# Patient Record
Sex: Female | Born: 1950 | Race: White | Hispanic: No | Smoking: Former smoker
Health system: Southern US, Community
[De-identification: ages and names within clinical notes are randomized; demographics above are authoritative.]

## PROBLEM LIST (undated history)

## (undated) DIAGNOSIS — M1990 Unspecified osteoarthritis, unspecified site: Secondary | ICD-10-CM

## (undated) DIAGNOSIS — C44601 Unspecified malignant neoplasm of skin of unspecified upper limb, including shoulder: Secondary | ICD-10-CM

## (undated) DIAGNOSIS — C44301 Unspecified malignant neoplasm of skin of nose: Secondary | ICD-10-CM

## (undated) DIAGNOSIS — H269 Unspecified cataract: Secondary | ICD-10-CM

## (undated) DIAGNOSIS — Z5189 Encounter for other specified aftercare: Secondary | ICD-10-CM

## (undated) DIAGNOSIS — E7800 Pure hypercholesterolemia, unspecified: Secondary | ICD-10-CM

## (undated) DIAGNOSIS — K219 Gastro-esophageal reflux disease without esophagitis: Secondary | ICD-10-CM

## (undated) DIAGNOSIS — F419 Anxiety disorder, unspecified: Secondary | ICD-10-CM

## (undated) DIAGNOSIS — T7840XA Allergy, unspecified, initial encounter: Secondary | ICD-10-CM

## (undated) DIAGNOSIS — K635 Polyp of colon: Secondary | ICD-10-CM

## (undated) HISTORY — DX: Unspecified cataract: H26.9

## (undated) HISTORY — DX: Anxiety disorder, unspecified: F41.9

## (undated) HISTORY — PX: SKIN CANCER EXCISION: SHX779

## (undated) HISTORY — DX: Pure hypercholesterolemia, unspecified: E78.00

## (undated) HISTORY — DX: Polyp of colon: K63.5

## (undated) HISTORY — PX: ABDOMINAL HYSTERECTOMY: SHX81

## (undated) HISTORY — DX: Encounter for other specified aftercare: Z51.89

## (undated) HISTORY — DX: Unspecified osteoarthritis, unspecified site: M19.90

## (undated) HISTORY — DX: Unspecified malignant neoplasm of skin of unspecified upper limb, including shoulder: C44.601

## (undated) HISTORY — PX: OTHER SURGICAL HISTORY: SHX169

## (undated) HISTORY — PX: VAGINAL HYSTERECTOMY: SHX2639

## (undated) HISTORY — DX: Gastro-esophageal reflux disease without esophagitis: K21.9

## (undated) HISTORY — DX: Allergy, unspecified, initial encounter: T78.40XA

## (undated) HISTORY — DX: Unspecified malignant neoplasm of skin of nose: C44.301

---

## 1974-01-05 HISTORY — PX: ECTOPIC PREGNANCY SURGERY: SHX613

## 2005-01-05 HISTORY — PX: ADRENALECTOMY: SHX876

## 2007-01-06 HISTORY — PX: AORTIC/RENAL BYPASS: SHX6299

## 2009-01-05 HISTORY — PX: HERNIA REPAIR: SHX51

## 2014-01-20 DIAGNOSIS — N390 Urinary tract infection, site not specified: Secondary | ICD-10-CM | POA: Insufficient documentation

## 2014-01-20 DIAGNOSIS — I739 Peripheral vascular disease, unspecified: Secondary | ICD-10-CM | POA: Insufficient documentation

## 2014-01-20 DIAGNOSIS — K439 Ventral hernia without obstruction or gangrene: Secondary | ICD-10-CM | POA: Insufficient documentation

## 2014-01-20 DIAGNOSIS — G250 Essential tremor: Secondary | ICD-10-CM | POA: Insufficient documentation

## 2014-01-20 DIAGNOSIS — K635 Polyp of colon: Secondary | ICD-10-CM | POA: Insufficient documentation

## 2015-01-28 DIAGNOSIS — M25559 Pain in unspecified hip: Secondary | ICD-10-CM | POA: Insufficient documentation

## 2015-07-23 DIAGNOSIS — J069 Acute upper respiratory infection, unspecified: Secondary | ICD-10-CM | POA: Insufficient documentation

## 2016-03-31 DIAGNOSIS — E041 Nontoxic single thyroid nodule: Secondary | ICD-10-CM | POA: Insufficient documentation

## 2017-04-01 DIAGNOSIS — R9431 Abnormal electrocardiogram [ECG] [EKG]: Secondary | ICD-10-CM | POA: Insufficient documentation

## 2017-04-02 DIAGNOSIS — E781 Pure hyperglyceridemia: Secondary | ICD-10-CM | POA: Insufficient documentation

## 2019-08-08 DIAGNOSIS — Z87891 Personal history of nicotine dependence: Secondary | ICD-10-CM | POA: Insufficient documentation

## 2020-04-03 ENCOUNTER — Other Ambulatory Visit: Payer: Self-pay

## 2020-04-03 ENCOUNTER — Encounter: Payer: Self-pay | Admitting: Osteopathic Medicine

## 2020-04-03 ENCOUNTER — Ambulatory Visit (INDEPENDENT_AMBULATORY_CARE_PROVIDER_SITE_OTHER): Payer: Medicare Other | Admitting: Osteopathic Medicine

## 2020-04-03 VITALS — BP 145/83 | HR 69 | Temp 98.2°F | Ht 62.0 in | Wt 169.0 lb

## 2020-04-03 DIAGNOSIS — R6889 Other general symptoms and signs: Secondary | ICD-10-CM

## 2020-04-03 DIAGNOSIS — E896 Postprocedural adrenocortical (-medullary) hypofunction: Secondary | ICD-10-CM

## 2020-04-03 DIAGNOSIS — E782 Mixed hyperlipidemia: Secondary | ICD-10-CM | POA: Diagnosis not present

## 2020-04-03 DIAGNOSIS — F419 Anxiety disorder, unspecified: Secondary | ICD-10-CM

## 2020-04-03 DIAGNOSIS — F339 Major depressive disorder, recurrent, unspecified: Secondary | ICD-10-CM | POA: Diagnosis not present

## 2020-04-03 DIAGNOSIS — R0602 Shortness of breath: Secondary | ICD-10-CM

## 2020-04-03 DIAGNOSIS — R233 Spontaneous ecchymoses: Secondary | ICD-10-CM

## 2020-04-03 DIAGNOSIS — K635 Polyp of colon: Secondary | ICD-10-CM

## 2020-04-03 DIAGNOSIS — Z87891 Personal history of nicotine dependence: Secondary | ICD-10-CM

## 2020-04-03 DIAGNOSIS — E041 Nontoxic single thyroid nodule: Secondary | ICD-10-CM

## 2020-04-03 DIAGNOSIS — M1991 Primary osteoarthritis, unspecified site: Secondary | ICD-10-CM | POA: Diagnosis not present

## 2020-04-03 DIAGNOSIS — R238 Other skin changes: Secondary | ICD-10-CM

## 2020-04-03 DIAGNOSIS — Z8639 Personal history of other endocrine, nutritional and metabolic disease: Secondary | ICD-10-CM

## 2020-04-03 DIAGNOSIS — Z85828 Personal history of other malignant neoplasm of skin: Secondary | ICD-10-CM

## 2020-04-03 MED ORDER — LOVASTATIN 20 MG PO TABS: 20.0000 mg | ORAL_TABLET | Freq: Every day | ORAL | 3 refills | Status: DC

## 2020-04-03 NOTE — Progress Notes (Signed)
Jodi Cobb is a 70 y.o. female who presents to  [place] at [facility]  today, 04/04/20, seeking care for the following: new to establish, see headings below    CARDIOVASCULAR Hx: high cholesterol, aortic bifemoral grafting as result of repair of aortic laceration (oh my!) intraoperatively as complication of adrenalectomy.  Meds: Lovastatin Males: AAA screening if smoker? N/a re: female smoker BUT not sure if she has had AAA screening given surgical history? Await records, would probably want to get [place] for this if she hasn't had it done, or refer to vascular surgery to monitor / follow   RESPIRATORY Hx: former smoker, quit in 0350 after complicated adrenalectomy Meds: none Tobacco history: 2 ppd x 30 years Lung cancer screening? Await records, probably needs CT   PSYCHIATRIC Hx: anxiety, depression Meds: none, she feels well-controlled   GASTROINTESTINAL Hx: frequent constipation, hx colon polyps last colonoscopy 2021 Meds: stool softener and probiotic Colon cancer screening: need records from PCP  ENDOCRINE Hx: history thyroid nodule, reports was getting [place] annually to followup on this and is due for this test.  Hx: adrenalectomy for hx cushing's, complicated by aortic laceration intraoperatively Meds: none  REPRODUCTIVE Hx: [id number], no complications but hx miscarriage and ectopic, s/p hysterectomy for benign disease in 1999  MUSCULOSKELETAL/RHEUM Hx: arthritis, patellar fracture s/p surgical repair Meds: NSAID prn   SKIN Hx: skin cancer  INFECTIOUS DISEASE Hx: no problems Meds: none HIV screening? Aged out Hep C screening? Need records from previous PCP Vaccines needed? decliend Tdap booster, has had flu shot this year, has had COVID series x3, reports has had 2 pneumonia vaccines         ASSESSMENT & PLAN with other pertinent findings:  The primary encounter diagnosis was Mixed hyperlipidemia. 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    Patient Instructions  Thyroid ultrasound [phone] Can get labs fasting same day   Orders Placed This Encounter  Procedures  . US THYROID  . CBC  . COMPLETE METABOLIC PANEL WITH GFR  . Lipid panel  . TSH    Meds ordered this encounter  Medications  . lovastatin (MEVACOR) 20 MG tablet    Sig: Take 1 tablet (20 mg total) by mouth daily.    Dispense:  90 tablet    Refill:  3     See below for relevant physical exam findings  See below for recent lab and imaging results reviewed  Medications, allergies, PMH, PSH, SocH, FamH reviewed below    Follow-up instructions: Return in about 6 months (around 10/04/2020) for ROUTINE CHECK-UP , SEE [place] SOONER IF NEEDED. CALL/MESSAGE W/ QUESTIONS.                                        Exam:  BP (!) 145/83 (BP Location: Left Arm, Patient Position: Sitting, Cuff Size: Normal)   Pulse 69   Temp 98.2 F (36.8 C) (Oral)   Ht 5\' 2"  (1.575 m)   Wt 169 lb (76.7 kg)   BMI 30.91 kg/m   Constitutional: VS see above. General Appearance: alert, well-developed, well-nourished,  NAD  Neck: No masses, trachea midline.   Respiratory: Normal respiratory effort. no wheeze, no rhonchi, no rales  Cardiovascular: S1/S2 normal, no murmur, no rub/gallop auscultated. RRR.   Musculoskeletal: Gait normal. Symmetric and independent movement of all extremities  Abdominal: non-tender, non-distended, no appreciable organomegaly, neg Murphy's, BS WNLx4  Neurological: Normal balance/coordination. No tremor.  Skin: warm, dry,  intact.   Psychiatric: Normal judgment/insight. Normal mood and affect. Oriented x3.   Current Meds  Medication Sig  . aspirin EC 81 MG tablet Take 81 mg by mouth daily. Swallow whole.  . Calcium Carbonate-Vit D-Min (CALCIUM 1200 PO) Take by mouth.  . docusate sodium (STOOL SOFTENER) 100 MG capsule Take 100 mg by mouth 2 (two) times daily.  Marland Kitchen latanoprost (XALATAN) 0.005 % ophthalmic solution 1 drop at bedtime.  . Multiple Vitamin (MULTIVITAMIN) tablet Take 1 tablet by mouth daily.  . Omega-3 Fatty Acids (FISH OIL) 1000 MG CAPS Take by mouth.  . Probiotic Product (PROBIOTIC-10 PO) Take by mouth.  . vitamin C (ASCORBIC ACID) 500 MG tablet Take 500 mg by mouth daily.  Marland Kitchen VITAMIN D, CHOLECALCIFEROL, PO Take by mouth.  . [DISCONTINUED] lovastatin (MEVACOR) 20 MG tablet Take 20 mg by mouth daily.    Allergies  Allergen Reactions  . Penicillins Hives and Itching    There are no problems to display for this patient.   Family History  Problem Relation Age of Onset  . Alzheimer's disease Mother   . Pancreatic cancer Father   . Lung cancer Sister     Social History   Tobacco Use  Smoking Status Former Smoker  . Packs/day: 2.00  . Types: Cigarettes  . Quit date: 65  . Years since quitting: 30.2  Smokeless Tobacco Never Used    Past Surgical History:  Procedure Laterality Date  . ADRENALECTOMY Right 2007  . AORTIC/RENAL BYPASS  2009  . Broken Bone Repair Left   . [place]  . HERNIA REPAIR Right 2011  . SKIN CANCER EXCISION    . VAGINAL HYSTERECTOMY      Immunization History  Administered Date(s) Administered  . Influenza-Unspecified 01/06/2020  . Moderna Sars-Covid-2 Vaccination 03/01/2019, 03/29/2019, 11/09/2019    No results found for this or any previous visit (from the past 2160 hour(s)).  US THYROID  Result Date: 04/04/2020 CLINICAL DATA:  Nodule follow-up EXAM: THYROID ULTRASOUND TECHNIQUE: Ultrasound examination of the thyroid gland  and adjacent soft tissues was performed. COMPARISON:  None. FINDINGS: Parenchymal Echotexture: Mildly heterogeneous Isthmus: 0.4 cm Right lobe: 4.3 x 1.9 x 1.3 cm Left lobe: 3.2 x 1.7 x 0.9 cm _________________________________________________________ Estimated total number of nodules >/= 1 cm: 1 Number of spongiform nodules >/=  2 cm not described below (TR1): 0 Number of mixed cystic and solid nodules >/= 1.5 cm not described below (TR2): 0 _________________________________________________________ Nodule # 1: Location: Right; superior Maximum size: 1.6 cm; Other 2 dimensions: 0.8 x 1.1 cm Composition: solid/almost completely solid (2) Echogenicity: hypoechoic (2) Shape: not taller-than-wide (0) Margins: smooth (0) Echogenic foci: none (0) ACR TI-RADS total points: 4. ACR TI-RADS risk category: TR4 (4-6 points). ACR TI-RADS recommendations: **Given size (>/= 1.5 cm) and appearance, fine needle aspiration of this moderately suspicious nodule should be considered based on TI-RADS criteria. _________________________________________________________ IMPRESSION: Nodule 1 (TI-RADS 4) located in the superior right thyroid lobe, measuring 1.6 x 0.8 x 1.1 cm, meets criteria for FNA. The above is in keeping with the ACR TI-RADS recommendations - J Am Coll Radiol  [id number]. Electronically Signed   By: Miachel Roux M.D.   On: 04/04/2020 16:08       All questions at time of visit were answered - patient instructed to contact office with any additional concerns or updates. ER/RTC precautions were reviewed with the patient as applicable.   Please note: manual typing as well as voice recognition software may have been used to produce this document - typos may escape review. Please contact Dr. Sheppard Coil for any needed clarifications.

## 2020-04-03 NOTE — Patient Instructions (Signed)
Thyroid ultrasound [phone] Can get labs fasting same day

## 2020-04-04 ENCOUNTER — Encounter: Payer: Self-pay | Admitting: Osteopathic Medicine

## 2020-04-04 ENCOUNTER — Ambulatory Visit (INDEPENDENT_AMBULATORY_CARE_PROVIDER_SITE_OTHER): Payer: Medicare Other

## 2020-04-04 DIAGNOSIS — E041 Nontoxic single thyroid nodule: Secondary | ICD-10-CM | POA: Diagnosis not present

## 2020-04-05 ENCOUNTER — Telehealth: Payer: Self-pay

## 2020-04-05 NOTE — Telephone Encounter (Signed)
Pt dropped off medical records for Dr. Sheppard Coil. She stated that she printed some of them off, and wanted the Dr. To have what she had. Paperwork placed in message box. tvt

## 2020-04-06 LAB — CBC
HCT: 42.3 % (ref 35.0–45.0)
Hemoglobin: 14.3 g/dL (ref 11.7–15.5)
MCH: 31.8 pg (ref 27.0–33.0)
MCHC: 33.8 g/dL (ref 32.0–36.0)
MCV: 94 fL (ref 80.0–100.0)
MPV: 11.1 fL (ref 7.5–12.5)
Platelets: 218 10*3/uL (ref 140–400)
RBC: 4.5 10*6/uL (ref 3.80–5.10)
RDW: 11.8 % (ref 11.0–15.0)
WBC: 4.5 10*3/uL (ref 3.8–10.8)

## 2020-04-06 LAB — COMPLETE METABOLIC PANEL WITH GFR
AG Ratio: 1.5 (calc) (ref 1.0–2.5)
ALT: 24 U/L (ref 6–29)
AST: 24 U/L (ref 10–35)
Albumin: 4.3 g/dL (ref 3.6–5.1)
Alkaline phosphatase (APISO): 74 U/L (ref 37–153)
BUN: 11 mg/dL (ref 7–25)
CO2: 26 mmol/L (ref 20–32)
Calcium: 10 mg/dL (ref 8.6–10.4)
Chloride: 103 mmol/L (ref 98–110)
Creat: 0.68 mg/dL (ref 0.60–0.93)
GFR, Est African American: 102 mL/min/{1.73_m2} (ref >=60)
GFR, Est Non African American: 88 mL/min/{1.73_m2} (ref >=60)
Globulin: 2.9 g/dL (calc) (ref 1.9–3.7)
Glucose, Bld: 102 mg/dL — ABNORMAL HIGH (ref 65–99)
Potassium: 4.2 mmol/L (ref 3.5–5.3)
Sodium: 140 mmol/L (ref 135–146)
Total Bilirubin: 1 mg/dL (ref 0.2–1.2)
Total Protein: 7.2 g/dL (ref 6.1–8.1)

## 2020-04-06 LAB — LIPID PANEL
Cholesterol: 195 mg/dL (ref <200)
HDL: 66 mg/dL (ref >=50)
LDL Cholesterol (Calc): 96 mg/dL (calc)
Non-HDL Cholesterol (Calc): 129 mg/dL (calc) (ref <130)
Total CHOL/HDL Ratio: 3 (calc) (ref <5.0)
Triglycerides: 237 mg/dL — ABNORMAL HIGH (ref <150)

## 2020-04-06 LAB — TSH: TSH: 2.55 mIU/L (ref 0.40–4.50)

## 2020-04-09 NOTE — Addendum Note (Signed)
Addended by: Maryla Morrow on: 04/09/2020 04:28 PM   Modules accepted: Orders

## 2020-04-19 ENCOUNTER — Ambulatory Visit
Admission: RE | Admit: 2020-04-19 | Discharge: 2020-04-19 | Disposition: A | Discharge status: Home / Self Care | Payer: Medicare Other | Source: Ambulatory Visit | Attending: Osteopathic Medicine | Admitting: Osteopathic Medicine

## 2020-04-19 ENCOUNTER — Other Ambulatory Visit (HOSPITAL_COMMUNITY)
Admission: RE | Admit: 2020-04-19 | Discharge: 2020-04-19 | Disposition: A | Discharge status: Home / Self Care | Payer: Medicare Other | Source: Ambulatory Visit | Attending: Physician Assistant | Admitting: Physician Assistant

## 2020-04-19 ENCOUNTER — Other Ambulatory Visit: Payer: Self-pay

## 2020-04-19 DIAGNOSIS — E041 Nontoxic single thyroid nodule: Secondary | ICD-10-CM | POA: Diagnosis present

## 2020-04-19 DIAGNOSIS — D34 Benign neoplasm of thyroid gland: Secondary | ICD-10-CM | POA: Diagnosis not present

## 2020-04-22 LAB — CYTOLOGY - NON PAP

## 2020-05-29 ENCOUNTER — Ambulatory Visit (INDEPENDENT_AMBULATORY_CARE_PROVIDER_SITE_OTHER): Payer: Medicare Other

## 2020-05-29 ENCOUNTER — Ambulatory Visit: Payer: Medicare Other | Admitting: Osteopathic Medicine

## 2020-05-29 ENCOUNTER — Other Ambulatory Visit: Payer: Self-pay

## 2020-05-29 ENCOUNTER — Ambulatory Visit (INDEPENDENT_AMBULATORY_CARE_PROVIDER_SITE_OTHER): Payer: Medicare Other | Admitting: Medical-Surgical

## 2020-05-29 ENCOUNTER — Encounter: Payer: Self-pay | Admitting: Medical-Surgical

## 2020-05-29 VITALS — BP 109/69 | HR 74 | Temp 99.0°F | Ht 62.0 in | Wt 170.4 lb

## 2020-05-29 DIAGNOSIS — K439 Ventral hernia without obstruction or gangrene: Secondary | ICD-10-CM

## 2020-05-29 DIAGNOSIS — R109 Unspecified abdominal pain: Secondary | ICD-10-CM

## 2020-05-29 DIAGNOSIS — R194 Change in bowel habit: Secondary | ICD-10-CM | POA: Diagnosis not present

## 2020-05-29 NOTE — Progress Notes (Signed)
Subjective:    CC: Right abdominal pain  HPI: Pleasant 70 year old female presenting today for evaluation of right abdominal pain.  In 2017 she had an adrenalectomy surgery which ended up with her having hernia.  In 2011 this was surgically repaired with a double mesh.  Over the last 2 weeks, she notes that she has had intermittent pain that is described as grabbing and breath taking.  The pain is located in the area of the double mesh and is most often noted when she is getting up from a sitting position but also happens when she is walking at times.  She has had the pain at least once a day every day.  Denies urinary changes, fever, and chills.  Does note that she has a history of bowel issues but she has been taking a stool softener as prescribed since her surgeries with no issues.  Over the past couple of weeks, she feels like she is not able to defecate in the typical fashion.  She has been taking her stool softener as prescribed but has also started using Dulcolax suppositories.  Notes that her bowel changes may be related to the pain that she feels when she strains to go.  Her husband is in rehab after having amputation and he is nearing his discharge to come home so she wants to make sure that there are no issues with her health that may interfere with caring for him.  I reviewed the past medical history, family history, social history, surgical history, and allergies today and no changes were needed.  Please see the problem list section below in epic for further details.  Past Medical History: Past Medical History:  Diagnosis Date  . Cancer of skin of upper arm   . Colon polyps   . High cholesterol   . Skin cancer of nose    Past Surgical History: Past Surgical History:  Procedure Laterality Date  . ADRENALECTOMY Right 2007  . AORTIC/RENAL BYPASS  2009  . Broken Bone Repair Left   . [place]  . HERNIA REPAIR Right 2011   large ventral incisional hernia   .  SKIN CANCER EXCISION    . VAGINAL HYSTERECTOMY     Social History: Social History   Socioeconomic History  . Marital status: Married    Spouse name: Not on file  . Number of children: 1  . Years of education: Not on file  . Highest education level: Not on file  Occupational History  . Not on file  Tobacco Use  . Smoking status: Former Smoker    Packs/day: 2.00    Types: Cigarettes    Quit date: 1992    Years since quitting: 30.4  . Smokeless tobacco: Never Used  Vaping Use  . Vaping Use: Never used  Substance and Sexual Activity  . Alcohol use: Not Currently  . Drug use: Never  . Sexual activity: Not on file  Other Topics Concern  . Not on file  Social History Narrative  . Not on file   Social Determinants of Health   Financial Resource Strain: Not on file  Food Insecurity: Not on file  Transportation Needs: Not on file  Physical Activity: Not on file  Stress: Not on file  Social Connections: Not on file   Family History: Family History  Problem Relation Age of Onset  . Alzheimer's disease Mother   . Pancreatic cancer Father   . Lung cancer Sister    Allergies: Allergies  Allergen Reactions  . Penicillins Hives and Itching   Medications: See med rec.  Review of Systems: See HPI for pertinent positives and negatives.   Objective:    General: Well Developed, well nourished, and in no acute distress.  Neuro: Alert and oriented x3.  HEENT: Normocephalic, atraumatic.  Skin: Warm and dry. Cardiac: Regular rate and rhythm, no murmurs rubs or gallops, no lower extremity edema.  Respiratory: Clear to auscultation bilaterally. Not using accessory muscles, speaking in full sentences.   Impression and Recommendations:    1. Ventral hernia without obstruction or gangrene 2. Right lateral abdominal pain 3. Bowel habit changes With her extensive history and several abdominal surgeries, getting CT abdomen pelvis without contrast for further evaluation.  Patient  sent to imaging to get contrast so this can be completed today.  Further plan to follow once results are available. - CT Abdomen Pelvis Wo Contrast; Future  Return if symptoms worsen or fail to improve. ___________________________________________ Clearnce Sorrel, DNP, APRN, FNP-BC [facility]

## 2020-07-02 ENCOUNTER — Ambulatory Visit: Payer: Medicare Other

## 2020-07-10 ENCOUNTER — Ambulatory Visit (INDEPENDENT_AMBULATORY_CARE_PROVIDER_SITE_OTHER): Payer: Medicare Other | Admitting: Osteopathic Medicine

## 2020-07-10 DIAGNOSIS — Z780 Asymptomatic menopausal state: Secondary | ICD-10-CM

## 2020-07-10 DIAGNOSIS — Z1231 Encounter for screening mammogram for malignant neoplasm of breast: Secondary | ICD-10-CM | POA: Diagnosis not present

## 2020-07-10 DIAGNOSIS — Z0000 Encounter for general adult medical examination without abnormal findings: Secondary | ICD-10-CM

## 2020-07-10 NOTE — Patient Instructions (Addendum)
[facility] Maintenance Summary and Written Plan of Care  Jodi Cobb ,  Thank you for allowing me to perform your Medicare Annual Wellness Visit and for your ongoing commitment to your health.   Health Maintenance & Immunization History Health Maintenance  Topic Date Due   Zoster Vaccines- Shingrix (1 of 2) 10/10/2020 (Originally 05/05/1998)   TETANUS/TDAP  04/03/2021 (Originally 05/05/1969)   MAMMOGRAM  07/10/2021 (Originally [birth year]/05/1998)   DEXA SCAN  07/10/2021 (Originally 05/04/2013)   Hepatitis C Screening  07/10/2021 (Originally 05/05/1968)   INFLUENZA VACCINE  08/05/2020   COLONOSCOPY (Pts 45-27yrs Insurance coverage will need to be confirmed)  08/30/2029   COVID-19 Vaccine  Completed   PNA vac Low Risk Adult  Completed   HPV VACCINES  Aged Out   Immunization History  Administered Date(s) Administered   Influenza Split 08/20/2013, 09/27/2014   Influenza-Unspecified 10/14/2016, 10/01/2017, 01/06/2020   Moderna SARS-COV2 Booster Vaccination 04/12/2020   Moderna Sars-Covid-2 Vaccination 03/01/2019, 03/29/2019, 11/09/2019   Pneumococcal Conjugate-13 01/20/2014   Pneumococcal Polysaccharide-23 01/28/2015, 10/01/2017    These are the patient goals that we discussed:  Goals Addressed               This Visit's Progress     Patient Stated (pt-stated)        07/10/2020 AWV Goal: Exercise for General Health  Patient will verbalize understanding of the benefits of increased physical activity: Exercising regularly is important. It will improve your overall fitness, flexibility, and endurance. Regular exercise also will improve your overall health. It can help you control your weight, reduce stress, and improve your bone density. Over the next year, patient will increase physical activity as tolerated with a goal of at least 150 minutes of moderate physical activity per week.  You can tell that you are exercising at a moderate intensity if your heart  starts beating faster and you start breathing faster but can still hold a conversation. Moderate-intensity exercise ideas include: Walking 1 mile (1.6 km) in about 15 minutes Biking Hiking Golfing Dancing Water aerobics Patient will verbalize understanding of everyday activities that increase physical activity by providing examples like the following: Yard work, such as: Sales promotion account executive Gardening Washing windows or floors Patient will be able to explain general safety guidelines for exercising:  Before you start a new exercise program, talk with your health care provider. Do not exercise so much that you hurt yourself, feel dizzy, or get very short of breath. Wear comfortable clothes and wear shoes with good support. Drink plenty of water while you exercise to prevent dehydration or heat stroke. Work out until your breathing and your heartbeat get faster.           This is a list of Health Maintenance Items that are overdue or due now: Td vaccine Screening mammography Bone densitometry screening Shingrix vaccine  Orders/Referrals Placed Today: Orders Placed This Encounter  Procedures   DEXAScan    Standing Status:   Future    Standing Expiration Date:   07/10/2021    Scheduling Instructions:     Please call patient to schedule.    Order Specific Question:   Reason for exam:    Answer:   Post Menopausal    Order Specific Question:   Preferred imaging location?    Answer:   [facility]   Mammogram 3D SCREEN BREAST BILATERAL    Standing Status:   Future  Standing Expiration Date:   07/10/2021    Scheduling Instructions:     Please call patient to schedule.    Order Specific Question:   Reason for Exam (SYMPTOM  OR DIAGNOSIS REQUIRED)    Answer:   Breast cancer screening    Order Specific Question:   Preferred imaging location?    Answer:   [facility]   (Contact  our referral department at [phone] if you have not spoken with someone about your referral appointment within the next 5 days)    Follow-up Plan Follow-up with Emeterio Reeve, DO as planned Schedule your tetanus shot and your shingles vaccine at your pharmacy.  Referral for your bone density and dexa scan has been sent.  Medicare wellness visit in one year.  AVS printed and mailed to patient.

## 2020-07-10 NOTE — Progress Notes (Signed)
[place] VISIT  07/10/2020  Telephone Visit Disclaimer This Medicare AWV was conducted by telephone due to national recommendations for restrictions regarding the COVID-19 Pandemic (e.g. social distancing).  I verified, using two identifiers, that I am speaking with Jodi Cobb or their authorized healthcare agent. I discussed the limitations, risks, security, and privacy concerns of performing an evaluation and management service by telephone and the potential availability of an in-person appointment in the future. The patient expressed understanding and agreed to proceed.  Location of Patient: Home Location of Provider (nurse):  In the office.  Subjective:    Jodi Cobb is a 70 y.o. female patient of Jodi Reeve, DO who had a Medicare Annual Wellness Visit today via telephone. Jodi Cobb is Retired and lives with their spouse. she has 1 children. she reports that she is socially active and does interact with friends/family regularly. she is minimally physically active and enjoys playing word games on her tablet.  Patient Care Team: Jodi Reeve, DO as PCP - General (Osteopathic Medicine)  Advanced Directives 07/10/2020  Does Patient Have a Medical Advance Directive? No  Type of Advance Directive Living will;Healthcare Power of Attorney  Does patient want to make changes to medical advance directive? No - Patient declined  Copy of [place] in Chart? No - copy requested    Hospital Utilization Over the Past 12 Months: # of hospitalizations or ER visits: 0 # of surgeries: 0  Review of Systems    Patient reports that her overall health is unchanged compared to last year.  History obtained from chart review and the patient  Patient Reported Readings (BP, Pulse, CBG, Weight, etc) none  Pain Assessment Pain : No/denies pain     Current Medications & Allergies (verified) Allergies as of 07/10/2020       Reactions   Penicillins  Hives, Itching        Medication List        Accurate as of July 10, 2020  3:55 PM. If you have any questions, ask your nurse or doctor.          aspirin EC 81 MG tablet Take 81 mg by mouth daily. Swallow whole.   CALCIUM 1200 PO Take by mouth.   docusate sodium 100 MG capsule Commonly known as: COLACE Take 100 mg by mouth 2 (two) times daily.   Fish Oil 1000 MG Caps Take by mouth.   latanoprost 0.005 % ophthalmic solution Commonly known as: XALATAN 1 drop at bedtime.   lovastatin 20 MG tablet Commonly known as: MEVACOR Take 1 tablet (20 mg total) by mouth daily.   multivitamin tablet Take 1 tablet by mouth daily.   PROBIOTIC-10 PO Take by mouth.   vitamin C 500 MG tablet Commonly known as: ASCORBIC ACID Take 500 mg by mouth daily.   VITAMIN D (CHOLECALCIFEROL) PO Take by mouth.        History (reviewed): Past Medical History:  Diagnosis Date   Cancer of skin of upper arm    Colon polyps    High cholesterol    Skin cancer of nose    Past Surgical History:  Procedure Laterality Date   ADRENALECTOMY Right 2007   AORTIC/RENAL BYPASS  2009   Broken Bone Repair Left    ECTOPIC PREGNANCY SURGERY  1976   HERNIA REPAIR Right 2011   large ventral incisional hernia    SKIN CANCER EXCISION     VAGINAL HYSTERECTOMY     Family History  Problem Relation Age of Onset   Alzheimer's disease Mother    Pancreatic cancer Father    Lung cancer Sister    Social History   Socioeconomic History   Marital status: Married    Spouse name: Jodi Cobb   Number of children: 1   Years of education: 12   Highest education level: 12th grade  Occupational History   Not on file  Tobacco Use   Smoking status: Former    Packs/day: 2.00    Pack years: 0.00    Types: Cigarettes    Quit date: 1992    Years since quitting: 30.5   Smokeless tobacco: Never  Vaping Use   Vaping Use: Never used  Substance and Sexual Activity   Alcohol use: Not Currently   Drug use:  Never   Sexual activity: Not on file  Other Topics Concern   Not on file  Social History Narrative   Lives with her husband. She has one child. 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 She enjoys playing word games on her tablet.   Social Determinants of Health   Financial Resource Strain: Low Risk    Difficulty of Paying Living Expenses: Not hard at all  Food Insecurity: No Food Insecurity   Worried About Charity fundraiser in the Last Year: Never true   [place] in the Last Year: Never true  Transportation Needs: No Transportation Needs   Lack of Transportation (Medical): No   Lack of Transportation (Non-Medical): No  Physical Activity: Inactive   Days of Exercise per Week: 0 days   Minutes of Exercise per Session: 0 min  Stress: Stress Concern Present   Feeling of Stress : To some extent  Social Connections: Moderately Isolated   Frequency of Communication with Friends and Family: More than three times a week   Frequency of Social Gatherings with Friends and Family: Once a week   Attends Religious Services: Never   Marine scientist or Organizations: No   Attends Archivist Meetings: Never   Marital Status: Married    Activities of Daily Living In your present state of health, do you have any difficulty performing the following activities: 07/10/2020  Hearing? N  Vision? N  Difficulty concentrating or making decisions? Y  Comment has noticed some memory loss and writes everything down (says its due to stress).  Walking or climbing stairs? N  Dressing or bathing? N  Doing errands, shopping? N  Preparing Food and eating ? N  Using the Toilet? N  In the past six months, have you accidently leaked urine? Y  Comment stress incontinence  Do you have problems with loss of bowel control? N  Managing your Medications? N  Managing your Finances? N  Housekeeping or managing your Housekeeping? N    Patient Education/ Literacy How  often do you need to have someone help you when you read instructions, pamphlets, or other written materials from your doctor or pharmacy?: 1 - Never What is the last grade level you completed in school?: 12th grade  Exercise Current Exercise Habits: The patient does not participate in regular exercise at present (full time caregiver for her husband.), Exercise limited by: None identified  Diet Patient reports consuming 2 meals a day and 1-2 snack(s) a day Patient reports that her primary diet is: Regular Patient reports that she does have regular access to food.   Depression Screen PHQ 2/9 Scores 07/10/2020 04/03/2020  PHQ -  2 Score 3 1  PHQ- 9 Score 6 4     Fall Risk Fall Risk  07/10/2020  Falls in the past year? 1  Number falls in past yr: 1  Injury with Fall? 0  Risk for fall due to : History of fall(s)  Follow up Falls evaluation completed;Education provided;Falls prevention discussed     Objective:  Everli Rother seemed alert and oriented and she participated appropriately during our telephone visit.  Blood Pressure Weight BMI  BP Readings from Last 3 Encounters:  05/29/20 109/69  04/03/20 (!) 145/83   Wt Readings from Last 3 Encounters:  05/29/20 170 lb 6.4 oz (77.3 kg)  04/03/20 169 lb (76.7 kg)   BMI Readings from Last 1 Encounters:  05/29/20 31.17 kg/m    *Unable to obtain current vital signs, weight, and BMI due to telephone visit type  Hearing/Vision  Bernette did not seem to have difficulty with hearing/understanding during the telephone conversation Reports that she has had a formal eye exam by an eye care professional within the past year Reports that she has not had a formal hearing evaluation within the past year *Unable to fully assess hearing and vision during telephone visit type  Cognitive Function: 6CIT Screen 07/10/2020  What Year? 0 points  What month? 0 points  What time? 0 points  Count back from 20 0 points  Months in reverse 0 points  Repeat  phrase 0 points  Total Score 0   (Normal:0-7, Significant for Dysfunction: >8)  Normal Cognitive Function Screening: Yes   Immunization & Health Maintenance Record Immunization History  Administered Date(s) Administered   Influenza Split 08/20/2013, 09/27/2014   Influenza-Unspecified 10/14/2016, 10/01/2017, 01/06/2020   Moderna SARS-COV2 Booster Vaccination 04/12/2020   Moderna Sars-Covid-2 Vaccination 03/01/2019, 03/29/2019, 11/09/2019   Pneumococcal Conjugate-13 01/20/2014   Pneumococcal Polysaccharide-23 01/28/2015, 10/01/2017    Health Maintenance  Topic Date Due   Zoster Vaccines- Shingrix (1 of 2) 10/10/2020 (Originally 05/05/1998)   TETANUS/TDAP  04/03/2021 (Originally 05/05/1967)   MAMMOGRAM  07/10/2021 (Originally 05/05/1998)   DEXA SCAN  07/10/2021 (Originally 05/04/2013)   Hepatitis C Screening  07/10/2021 (Originally 05/05/1966)   INFLUENZA VACCINE  08/05/2020   COLONOSCOPY (Pts 45-72yrs Insurance coverage will need to be confirmed)  08/30/2029   COVID-19 Vaccine  Completed   PNA vac Low Risk Adult  Completed   HPV VACCINES  Aged Out       Assessment  This is a routine wellness examination for [employer].  Health Maintenance: Due or Overdue There are no preventive care reminders to display for this patient.   Jodi Cobb does not need a referral for Community Assistance: Care Management:   no Social Work:    no Prescription Assistance:  no Nutrition/Diabetes Education:  no   Plan:  Personalized Goals  Goals Addressed               This Visit's Progress     Patient Stated (pt-stated)        07/10/2020 AWV Goal: Exercise for General Health  Patient will verbalize understanding of the benefits of increased physical activity: Exercising regularly is important. It will improve your overall fitness, flexibility, and endurance. Regular exercise also will improve your overall health. It can help you control your weight, reduce stress, and improve  your bone density. Over the next year, patient will increase physical activity as tolerated with a goal of at least 150 minutes of moderate physical activity per week.  You can tell that you are  exercising at a moderate intensity if your heart starts beating faster and you start breathing faster but can still hold a conversation. Moderate-intensity exercise ideas include: Walking 1 mile (1.6 km) in about 15 minutes Biking Hiking Golfing Dancing Water aerobics Patient will verbalize understanding of everyday activities that increase physical activity by providing examples like the following: Yard work, such as: Sales promotion account executive Gardening Washing windows or floors Patient will be able to explain general safety guidelines for exercising:  Before you start a new exercise program, talk with your health care provider. Do not exercise so much that you hurt yourself, feel dizzy, or get very short of breath. Wear comfortable clothes and wear shoes with good support. Drink plenty of water while you exercise to prevent dehydration or heat stroke. Work out until your breathing and your heartbeat get faster.         Personalized Health Maintenance & Screening Recommendations  Td vaccine Screening mammography Bone densitometry screening Shingrix vaccine  Lung Cancer Screening Recommended: no (Low Dose CT Chest recommended if Age 38-80 years, 30 pack-year currently smoking OR have quit w/in past 15 years) Hepatitis C Screening recommended: yes HIV Screening recommended: no  Advanced Directives: Written information was not prepared per patient's request.  Referrals & Orders Orders Placed This Encounter  Procedures   [place] Follow-up with Jodi Reeve, DO as planned Schedule your tetanus shot and your shingles vaccine at your pharmacy.   Referral for your bone density and dexa scan has been sent.  Medicare wellness visit in one year.  AVS printed and mailed to patient.   I have personally reviewed and noted the following in the patient's chart:   Medical and social history Use of alcohol, tobacco or illicit drugs  Current medications and supplements Functional ability and status Nutritional status Physical activity Advanced directives List of other physicians Hospitalizations, surgeries, and ER visits in previous 12 months Vitals Screenings to include cognitive, depression, and falls Referrals and appointments  In addition, I have reviewed and discussed with Jodi Cobb certain preventive protocols, quality metrics, and best practice recommendations. A written personalized care plan for preventive services as well as general preventive health recommendations is available and can be mailed to the patient at her request.      Tinnie Gens, RN  07/10/2020

## 2020-07-11 ENCOUNTER — Ambulatory Visit: Payer: Medicare Other

## 2020-08-15 ENCOUNTER — Ambulatory Visit: Payer: Medicare Other

## 2020-08-22 ENCOUNTER — Ambulatory Visit (INDEPENDENT_AMBULATORY_CARE_PROVIDER_SITE_OTHER): Payer: Medicare Other

## 2020-08-22 ENCOUNTER — Other Ambulatory Visit: Payer: Self-pay

## 2020-08-22 DIAGNOSIS — Z1231 Encounter for screening mammogram for malignant neoplasm of breast: Secondary | ICD-10-CM

## 2020-08-22 DIAGNOSIS — Z0000 Encounter for general adult medical examination without abnormal findings: Secondary | ICD-10-CM

## 2020-10-07 ENCOUNTER — Ambulatory Visit (INDEPENDENT_AMBULATORY_CARE_PROVIDER_SITE_OTHER): Payer: Medicare Other | Admitting: Medical-Surgical

## 2020-10-07 ENCOUNTER — Encounter: Payer: Self-pay | Admitting: Medical-Surgical

## 2020-10-07 ENCOUNTER — Ambulatory Visit: Payer: Medicare Other | Admitting: Osteopathic Medicine

## 2020-10-07 ENCOUNTER — Other Ambulatory Visit: Payer: Self-pay

## 2020-10-07 VITALS — BP 140/82 | HR 71 | Resp 20 | Ht 62.0 in | Wt 171.0 lb

## 2020-10-07 DIAGNOSIS — Z7689 Persons encountering health services in other specified circumstances: Secondary | ICD-10-CM | POA: Diagnosis not present

## 2020-10-07 DIAGNOSIS — R5383 Other fatigue: Secondary | ICD-10-CM | POA: Insufficient documentation

## 2020-10-07 DIAGNOSIS — Z0000 Encounter for general adult medical examination without abnormal findings: Secondary | ICD-10-CM

## 2020-10-07 DIAGNOSIS — E782 Mixed hyperlipidemia: Secondary | ICD-10-CM

## 2020-10-07 DIAGNOSIS — E249 Cushing's syndrome, unspecified: Secondary | ICD-10-CM | POA: Insufficient documentation

## 2020-10-07 DIAGNOSIS — I70219 Atherosclerosis of native arteries of extremities with intermittent claudication, unspecified extremity: Secondary | ICD-10-CM | POA: Insufficient documentation

## 2020-10-07 DIAGNOSIS — F430 Acute stress reaction: Secondary | ICD-10-CM | POA: Diagnosis not present

## 2020-10-07 DIAGNOSIS — E2749 Other adrenocortical insufficiency: Secondary | ICD-10-CM | POA: Insufficient documentation

## 2020-10-07 NOTE — Progress Notes (Signed)
  HPI with pertinent ROS:   CC: establish care  HPI: Pleasant 70 year old female presenting today to transfer care to a new PCP.  Although she is usually very healthy, she is currently undergoing quite a bit of stress.  Her husband is hospitalized and being moved to hospice for end-of-life care tomorrow morning and she is struggling with associated feelings of grief and loss.  She is not currently taking any medications for anxiety or depression and understands that this is a process that she will have to go through due to the nature of his illness.  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  Not currently interested in starting a medication and her time is so crunched right now, is not interested in a referral to counseling.  She does have a hospice resources who will provide grief counseling should she desire.  Denies SI/HI.  Taking lovastatin as prescribed, tolerating well without side effects.  Has been on this medication for quite a while.  Her last labs were checked approximately 1 year ago when she would like to have these rechecked today.  I reviewed the past medical history, family history, social history, surgical history, and allergies today and no changes were needed.  Please see the problem list section below in epic for further details.   Physical exam:   General: Well Developed, well nourished, and in no acute distress.  Neuro: Alert and oriented x3.  HEENT: Normocephalic, atraumatic.  Skin: Warm and dry. Cardiac: Regular rate and rhythm, no murmurs rubs or gallops, no lower extremity edema.  Respiratory: Clear to auscultation bilaterally. Not using accessory muscles, speaking in full sentences.  Impression and Recommendations:    1. Encounter to establish care Reviewed available information and discussed care concerns with patient.   2. Mixed hyperlipidemia Checking  lipid panel today.  Continue lovastatin as prescribed. - Lipid panel  3. Preventative health care Checking blood counts, electrolytes, and kidney liver function. - CBC with Differential/Platelet - COMPLETE METABOLIC PANEL WITH GFR  4. Acute stress reaction  Discussed available resources and expectations during this time of grief and loss.  Should she decide that she needs the help of medication during this time, strongly advised her to reach out to [place].  If there is anything we can do on RN to help with this process, urged her to let us know.  Return for annual physical exam at your convenience. ___________________________________________ Clearnce Sorrel, DNP, APRN, FNP-BC [facility] [place]

## 2021-03-04 ENCOUNTER — Encounter: Payer: Self-pay | Admitting: Medical-Surgical

## 2021-04-14 ENCOUNTER — Other Ambulatory Visit: Payer: Self-pay | Admitting: Osteopathic Medicine

## 2021-07-03 ENCOUNTER — Other Ambulatory Visit: Payer: Self-pay

## 2021-07-03 DIAGNOSIS — E782 Mixed hyperlipidemia: Secondary | ICD-10-CM

## 2021-07-03 DIAGNOSIS — Z0000 Encounter for general adult medical examination without abnormal findings: Secondary | ICD-10-CM

## 2021-07-03 NOTE — Progress Notes (Signed)
Labs ordered.

## 2021-07-05 LAB — COMPLETE METABOLIC PANEL WITH GFR
AG Ratio: 1.7 (calc) (ref 1.0–2.5)
ALT: 21 U/L (ref 6–29)
AST: 21 U/L (ref 10–35)
Albumin: 4.3 g/dL (ref 3.6–5.1)
Alkaline phosphatase (APISO): 86 U/L (ref 37–153)
BUN: 14 mg/dL (ref 7–25)
CO2: 25 mmol/L (ref 20–32)
Calcium: 9.3 mg/dL (ref 8.6–10.4)
Chloride: 103 mmol/L (ref 98–110)
Creat: 0.66 mg/dL (ref 0.60–1.00)
Globulin: 2.6 g/dL (calc) (ref 1.9–3.7)
Glucose, Bld: 100 mg/dL — ABNORMAL HIGH (ref 65–99)
Potassium: 4.3 mmol/L (ref 3.5–5.3)
Sodium: 140 mmol/L (ref 135–146)
Total Bilirubin: 1.2 mg/dL (ref 0.2–1.2)
Total Protein: 6.9 g/dL (ref 6.1–8.1)
eGFR: 93 mL/min/{1.73_m2} (ref >=60)

## 2021-07-05 LAB — CBC WITH DIFFERENTIAL/PLATELET
Absolute Monocytes: 296 cells/uL (ref 200–950)
Basophils Absolute: 38 cells/uL (ref 0–200)
Basophils Relative: 1 %
Eosinophils Absolute: 141 cells/uL (ref 15–500)
Eosinophils Relative: 3.7 %
HCT: 41.6 % (ref 35.0–45.0)
Hemoglobin: 13.9 g/dL (ref 11.7–15.5)
Lymphs Abs: 1657 cells/uL (ref 850–3900)
MCH: 31.4 pg (ref 27.0–33.0)
MCHC: 33.4 g/dL (ref 32.0–36.0)
MCV: 93.9 fL (ref 80.0–100.0)
MPV: 12.7 fL — ABNORMAL HIGH (ref 7.5–12.5)
Monocytes Relative: 7.8 %
Neutro Abs: 1668 cells/uL (ref 1500–7800)
Neutrophils Relative %: 43.9 %
Platelets: 139 10*3/uL — ABNORMAL LOW (ref 140–400)
RBC: 4.43 10*6/uL (ref 3.80–5.10)
RDW: 12.2 % (ref 11.0–15.0)
Total Lymphocyte: 43.6 %
WBC: 3.8 10*3/uL (ref 3.8–10.8)

## 2021-07-05 LAB — LIPID PANEL W/REFLEX DIRECT LDL
Cholesterol: 186 mg/dL (ref <200)
HDL: 64 mg/dL (ref >=50)
LDL Cholesterol (Calc): 93 mg/dL (calc)
Non-HDL Cholesterol (Calc): 122 mg/dL (calc) (ref <130)
Total CHOL/HDL Ratio: 2.9 (calc) (ref <5.0)
Triglycerides: 195 mg/dL — ABNORMAL HIGH (ref <150)

## 2021-07-09 ENCOUNTER — Other Ambulatory Visit: Payer: Self-pay | Admitting: Medical-Surgical

## 2021-07-09 DIAGNOSIS — Z1231 Encounter for screening mammogram for malignant neoplasm of breast: Secondary | ICD-10-CM

## 2021-07-16 ENCOUNTER — Ambulatory Visit: Payer: Medicare Other | Admitting: Medical-Surgical

## 2021-08-27 ENCOUNTER — Encounter: Payer: Self-pay | Admitting: General Practice

## 2021-08-28 ENCOUNTER — Ambulatory Visit (INDEPENDENT_AMBULATORY_CARE_PROVIDER_SITE_OTHER): Payer: Medicare Other

## 2021-08-28 DIAGNOSIS — Z1231 Encounter for screening mammogram for malignant neoplasm of breast: Secondary | ICD-10-CM

## 2021-09-01 ENCOUNTER — Other Ambulatory Visit: Payer: Self-pay | Admitting: Medical-Surgical

## 2021-09-01 DIAGNOSIS — R928 Other abnormal and inconclusive findings on diagnostic imaging of breast: Secondary | ICD-10-CM

## 2021-09-12 ENCOUNTER — Ambulatory Visit: Payer: Medicare Other

## 2021-09-12 ENCOUNTER — Ambulatory Visit
Admission: RE | Admit: 2021-09-12 | Discharge: 2021-09-12 | Disposition: A | Discharge status: Home / Self Care | Payer: Medicare Other | Source: Ambulatory Visit | Attending: Medical-Surgical | Admitting: Medical-Surgical

## 2021-09-12 DIAGNOSIS — R928 Other abnormal and inconclusive findings on diagnostic imaging of breast: Secondary | ICD-10-CM

## 2021-10-09 ENCOUNTER — Ambulatory Visit (INDEPENDENT_AMBULATORY_CARE_PROVIDER_SITE_OTHER): Payer: Medicare Other | Admitting: Medical-Surgical

## 2021-10-09 ENCOUNTER — Encounter: Payer: Self-pay | Admitting: Medical-Surgical

## 2021-10-09 VITALS — BP 181/80 | HR 62 | Resp 20 | Ht 62.0 in | Wt 172.3 lb

## 2021-10-09 DIAGNOSIS — Z0000 Encounter for general adult medical examination without abnormal findings: Secondary | ICD-10-CM | POA: Diagnosis not present

## 2021-10-09 DIAGNOSIS — R030 Elevated blood-pressure reading, without diagnosis of hypertension: Secondary | ICD-10-CM

## 2021-10-09 DIAGNOSIS — R5383 Other fatigue: Secondary | ICD-10-CM | POA: Diagnosis not present

## 2021-10-09 DIAGNOSIS — E782 Mixed hyperlipidemia: Secondary | ICD-10-CM | POA: Diagnosis not present

## 2021-10-09 MED ORDER — LOVASTATIN 20 MG PO TABS
20.0000 mg | ORAL_TABLET | Freq: Every day | ORAL | 1 refills | Status: DC
Start: 2021-10-09 — End: 2022-04-07

## 2021-10-09 NOTE — Progress Notes (Signed)
Complete physical exam  Patient: Jodi Cobb   DOB: Jan 28, 1950   71 y.o. Female  MRN: [id number]  Subjective:    Chief Complaint  Patient presents with   Annual Exam   Jodi Cobb is a 71 y.o. female who presents today for a complete physical exam. She reports consuming a general diet.  Walks 3-5 miles per day.   She generally feels well. She reports sleeping well. She does not have additional problems to discuss today.    Most recent fall risk assessment:    07/12/2021   11:55 AM  Fall Risk   Falls in the past year? 0  Number falls in past yr: 0  Injury with Fall? 0     Most recent depression screenings:    10/09/2021    1:28 PM 10/09/2021    1:19 PM  PHQ 2/9 Scores  PHQ - 2 Score 0 0    Vision:Within last year and Dental: No current dental problems and Receives regular dental care    Patient Care Team: Samuel Bouche, NP as PCP - General (Nurse Practitioner)   Outpatient Medications Prior to Visit  Medication Sig   Cyanocobalamin (B-12) 2500 MCG TABS    aspirin EC 81 MG tablet Take 81 mg by mouth daily. Swallow whole.   Calcium Carbonate-Vit D-Min (CALCIUM 1200 PO) Take by mouth.   docusate sodium (COLACE) 100 MG capsule Take 100 mg by mouth 2 (two) times daily.   latanoprost (XALATAN) 0.005 % ophthalmic solution 1 drop at bedtime.   Multiple Vitamin (MULTIVITAMIN) tablet Take 1 tablet by mouth daily.   Omega-3 Fatty Acids (FISH OIL) 1000 MG CAPS Take by mouth.   Probiotic Product (PROBIOTIC-10 PO) Take by mouth.   vitamin C (ASCORBIC ACID) 500 MG tablet Take 500 mg by mouth daily.   VITAMIN D, CHOLECALCIFEROL, PO Take by mouth.   [DISCONTINUED] lovastatin (MEVACOR) 20 MG tablet TAKE 1 TABLET DAILY   No facility-administered medications prior to visit.   Review of Systems  Constitutional:  Positive for diaphoresis (hot flashes). Negative for chills, fever, malaise/fatigue and weight loss.  HENT:  Negative for congestion, ear pain, hearing loss, sinus pain  and sore throat.   Eyes:  Negative for blurred vision, photophobia and pain.  Respiratory:  Negative for cough, shortness of breath and wheezing.   Cardiovascular:  Negative for chest pain, palpitations and leg swelling.  Gastrointestinal:  Negative for abdominal pain, constipation, diarrhea, heartburn, nausea and vomiting.  Genitourinary:  Negative for dysuria, frequency and urgency.  Musculoskeletal:  Negative for falls and neck pain.  Skin:  Negative for itching and rash.  Neurological:  Positive for dizziness. Negative for weakness and headaches.  Endo/Heme/Allergies:  Negative for polydipsia. Does not bruise/bleed easily.  Psychiatric/Behavioral:  Negative for depression, substance abuse and suicidal ideas. The patient is not nervous/anxious.      Objective:    BP (!) 181/80   Pulse 62   Resp 20   Ht '5\' 2"'$  (1.575 m)   Wt 172 lb 4.8 oz (78.2 kg)   SpO2 95%   BMI 31.51 kg/m    Physical Exam Constitutional:      General: She is not in acute distress.    Appearance: Normal appearance. She is not ill-appearing.  HENT:     Head: Normocephalic and atraumatic.     Right Ear: Tympanic membrane, ear canal and external ear normal. There is no impacted cerumen.     Left Ear: Tympanic membrane, ear canal and  external ear normal. There is no impacted cerumen.     Nose: Nose normal. No congestion or rhinorrhea.     Mouth/Throat:     Mouth: Mucous membranes are moist.     Pharynx: No oropharyngeal exudate or posterior oropharyngeal erythema.  Eyes:     General: No scleral icterus.       Right eye: No discharge.        Left eye: No discharge.     Extraocular Movements: Extraocular movements intact.     Conjunctiva/sclera: Conjunctivae normal.     Pupils: Pupils are equal, round, and reactive to light.  Neck:     Thyroid: No thyromegaly.     Vascular: No carotid bruit or JVD.     Trachea: Trachea normal.  Cardiovascular:     Rate and Rhythm: Normal rate and regular rhythm.      Pulses: Normal pulses.     Heart sounds: Normal heart sounds. No murmur heard.    No friction rub. No gallop.  Pulmonary:     Effort: Pulmonary effort is normal. No respiratory distress.     Breath sounds: Normal breath sounds. No wheezing.  Abdominal:     General: Bowel sounds are normal. There is no distension.     Palpations: Abdomen is soft.     Tenderness: There is no abdominal tenderness. There is no guarding.  Musculoskeletal:        General: Normal range of motion.     Cervical back: Normal range of motion and neck supple.  Lymphadenopathy:     Cervical: No cervical adenopathy.  Skin:    General: Skin is warm and dry.  Neurological:     Mental Status: She is alert and oriented to person, place, and time.     Cranial Nerves: No cranial nerve deficit.  Psychiatric:        Mood and Affect: Mood normal.        Behavior: Behavior normal.        Thought Content: Thought content normal.        Judgment: Judgment normal.   No results found for any visits on 10/09/21.     Assessment & Plan:    Routine Health Maintenance and Physical Exam  Immunization History  Administered Date(s) Administered   Influenza Split 08/20/2013, 09/27/2014   Influenza-Unspecified 10/14/2016, 10/01/2017, 09/23/2020, 10/05/2021   Moderna SARS-COV2 Booster Vaccination 04/12/2020   Moderna Sars-Covid-2 Vaccination 03/01/2019, 03/29/2019, 11/09/2019   Pneumococcal Conjugate-13 01/20/2014   Pneumococcal Polysaccharide-23 01/28/2015, 10/01/2017   Health Maintenance  Topic Date Due   Hepatitis C Screening  Never done   TETANUS/TDAP  Never done   DEXA SCAN  Never done   COVID-19 Vaccine (4 - Moderna series) 10/25/2021 (Originally 06/07/2020)   Zoster Vaccines- Shingrix (1 of 2) 01/09/2022 (Originally 05/05/1998)   MAMMOGRAM  08/29/2023   COLONOSCOPY (Pts 45-47yr Insurance coverage will need to be confirmed)  08/30/2029   Pneumonia Vaccine 71 Years old  Completed   INFLUENZA VACCINE  Completed    HPV VACCINES  Aged Out   Discussed health benefits of physical activity, and encouraged her to engage in regular exercise appropriate for her age and condition.  1. Annual physical exam Deferring labs today. Last done in 06/2021. UTD on preventative care. Wellness information provided with AVS.   2. Mixed hyperlipidemia Lipids checked in June 2023.  Continue lovastatin as prescribed.  3. Fatigue, unspecified type Doing better with B12 and regular intentional exercise.   Return in about 6 months (  around 04/10/2022) for chronic disease follow up.   Samuel Bouche, NP

## 2021-10-10 ENCOUNTER — Ambulatory Visit
Admission: EM | Admit: 2021-10-10 | Discharge: 2021-10-10 | Disposition: A | Discharge status: Home / Self Care | Payer: Medicare Other | Attending: Family Medicine | Admitting: Family Medicine

## 2021-10-10 DIAGNOSIS — I10 Essential (primary) hypertension: Secondary | ICD-10-CM | POA: Diagnosis not present

## 2021-10-10 MED ORDER — CHLORTHALIDONE 25 MG PO TABS: 25.0000 mg | ORAL_TABLET | Freq: Every day | ORAL | 0 refills | Status: DC

## 2021-10-10 MED ORDER — AMLODIPINE BESYLATE 10 MG PO TABS: 10.0000 mg | ORAL_TABLET | Freq: Every day | ORAL | 0 refills | Status: DC

## 2021-10-10 NOTE — ED Provider Notes (Signed)
[facility]    CSN: [id number] Arrival date & time: 10/10/21  1439      History   Chief Complaint Chief Complaint  Patient presents with   Hypertension    HPI Jodi Cobb is a 71 y.o. female.   HPI Very pleasant 71 year old female presents with concern of elevated blood pressure issues/hypertension concerns since yesterday at PCPs office.  PMH significant for obesity, PVD, and Cushing's syndrome.  Patient reports blood pressure was 181/80 at PCPs office yesterday (10/09/2021) during annual physical exam.  Patient reports blood pressure this morning at 1130 am was 191/112.  BP was 180/97 in triage today.  Patient presents with blood pressure numbers taking between yesterday's PCP appointment and today patient reports diastolic blood pressure ranging between 31-517 and systolic blood pressure ranging between 157-191.  Past Medical History:  Diagnosis Date   Cancer of skin of upper arm    Colon polyps    High cholesterol    Skin cancer of nose     Patient Active Problem List   Diagnosis Date Noted   Mixed hyperlipidemia 10/09/2021   Cushing's syndrome (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      Home Medications    Prior to Admission medications   Medication Sig Start Date End Date Taking? Authorizing Provider  amLODipine (NORVASC) 10 MG tablet Take 1 tablet (10 mg total) by mouth daily. 10/10/21 11/09/21 Yes Eliezer Lofts, FNP  chlorthalidone (HYGROTON) 25 MG tablet Take 1 tablet (25 mg total) by mouth daily. 10/10/21 11/09/21 Yes Eliezer Lofts, FNP  aspirin EC 81 MG tablet Take 81 mg by mouth daily. Swallow whole.    [provider]  Calcium Carbonate-Vit D-Min (CALCIUM 1200 PO) Take by mouth.    [provider]  Cyanocobalamin (B-12) 2500 MCG TABS  09/13/21   [provider]  docusate sodium (COLACE) 100 MG capsule Take 100 mg by mouth 2 (two) times daily.    [provider]  latanoprost (XALATAN) 0.005 % ophthalmic solution 1 drop at bedtime. 01/28/20   [provider]  lovastatin (MEVACOR) 20 MG tablet Take 1 tablet (20 mg total) by mouth daily. 10/09/21   Samuel Bouche, NP  Multiple Vitamin (MULTIVITAMIN) tablet Take 1 tablet by mouth daily.    [provider]  Omega-3 Fatty Acids (FISH OIL) 1000 MG CAPS Take by mouth.    [provider]  Probiotic Product (PROBIOTIC-10 PO) Take by mouth.    [provider]  vitamin C (ASCORBIC ACID) 500 MG tablet Take 500 mg by mouth daily.  [provider]  VITAMIN D, CHOLECALCIFEROL, PO Take by mouth.    [provider]    Family History Family History  Problem Relation Age of Onset   Alzheimer's disease Mother    Pancreatic cancer Father    Lung cancer Sister     Social History Social History   Tobacco Use   Smoking status: Former    Packs/day: 2.00    Types: Cigarettes    Quit date: 1992    Years since quitting: 31.7   Smokeless tobacco: Never  Vaping Use   Vaping Use: Never used  Substance Use Topics   Alcohol use: Not  Currently   Drug use: Never     Allergies   Penicillins   Review of Systems Review of Systems  Cardiovascular:        Concern for recent elevated blood pressure measurements  Neurological:  Positive for headaches.  All other systems reviewed and are negative.    Physical Exam Triage Vital Signs ED Triage Vitals  Enc Vitals Group     BP      Pulse      Resp      Temp      Temp src      SpO2      Weight      Height      Head Circumference      Peak Flow      Pain Score      Pain Loc      Pain Edu?      Excl. in [place]?    No data found.  Updated Vital Signs BP (!) 178/106 (BP Location: Left Arm)   Pulse 65   Temp 98.7 F (37.1 C) (Oral)   Resp 16   Ht '5\' 2"'$  (1.575 m)   Wt 172 lb (78 kg)   SpO2 97%   BMI 31.46 kg/m      Physical Exam Vitals and nursing note reviewed.  Constitutional:      General: She is not in acute distress.    Appearance: Normal appearance. She is obese. She is not ill-appearing.  HENT:     Head: Normocephalic and atraumatic.     Right Ear: Tympanic membrane, ear canal and external ear normal.     Left Ear: Tympanic membrane, ear canal and external ear normal.     Mouth/Throat:     Mouth: Mucous membranes are moist.     Pharynx: Oropharynx is clear.  Eyes:     Extraocular Movements: Extraocular movements intact.     Conjunctiva/sclera: Conjunctivae normal.     Pupils: Pupils are equal, round, and reactive to light.  Neck:     Comments: No JVD, no bruit Cardiovascular:     Rate and Rhythm: Normal rate and regular rhythm.     Pulses: Normal pulses.     Heart sounds: Normal heart sounds.     Comments: Hypertensive, manual BP-left arm seated: 178/106 at onset of my exam this afternoon, no S3, S4 Pulmonary:     Effort: Pulmonary effort is normal.     Breath sounds: Normal breath sounds. No wheezing, rhonchi or rales.  Musculoskeletal:        General: Normal range of motion.     Cervical back: Normal range of motion and neck  supple.  Skin:    General: Skin is warm and dry.  Neurological:     General: No focal deficit present.     Mental Status: She is alert and  oriented to person, place, and time.      UC Treatments / Results  Labs (all labs ordered are listed, but only abnormal results are displayed) Labs Reviewed - No data to display  EKG   Radiology No results found.  Procedures Procedures (including critical care time)  Medications Ordered in UC Medications - No data to display  Initial Impression / Assessment and Plan / UC Course  I have reviewed the triage vital signs and the nursing notes.  Pertinent labs & imaging results that were available during my care of the patient were reviewed by me and considered in my medical decision making (see chart for details).     MDM: 1.  Essential hypertension-Rx'd Chlorthalidone, Amlodipine. Advised patient to take medication as directed.  Encouraged patient increase daily water intake to 64 ounces per day.  Advised patient to check blood pressure in the morning prior to breakfast and in the evening prior to dinner and to log these measurements so that PCP may better understand daily blood pressure trends. Advised patient if symptoms worsen and/or unresolved please follow-up with PCP or here for further evaluation.  Patient discharged home, hemodynamically stable. Final Clinical Impressions(s) / UC Diagnoses   Final diagnoses:  Essential hypertension     Discharge Instructions      Advised patient to take medication as directed.  Encouraged patient increase daily water intake to 64 ounces per day.  Advised patient to check blood pressure in the morning prior to breakfast and in the evening prior to dinner and to log these measurements so that PCP may better understand daily blood pressure trends. Advised patient if symptoms worsen and/or unresolved please follow-up with PCP or here for further evaluation.     ED Prescriptions     Medication  Sig Dispense Auth. Provider   chlorthalidone (HYGROTON) 25 MG tablet Take 1 tablet (25 mg total) by mouth daily. 30 tablet Eliezer Lofts, FNP   amLODipine (NORVASC) 10 MG tablet Take 1 tablet (10 mg total) by mouth daily. 30 tablet Eliezer Lofts, FNP      PDMP not reviewed this encounter.   Eliezer Lofts, [place] 10/10/21 1558

## 2021-10-10 NOTE — ED Triage Notes (Addendum)
Pt presents to Urgent Care with c/o hypertension since yesterday at PCP's office. Was advised to check and record BP and states she has remained hypertensive. Reports BP was 191/112 today at 1130. States she has mild headache, no blurred vision.

## 2021-10-10 NOTE — Discharge Instructions (Addendum)
Advised patient to take medication as directed.  Encouraged patient increase daily water intake to 64 ounces per day.  Advised patient to check blood pressure in the morning prior to breakfast and in the evening prior to dinner and to log these measurements so that PCP may better understand daily blood pressure trends. Advised patient if symptoms worsen and/or unresolved please follow-up with PCP or here for further evaluation.

## 2021-10-11 ENCOUNTER — Telehealth: Payer: Self-pay

## 2021-10-11 NOTE — Telephone Encounter (Signed)
Called pt to check on status since UC visit. Pt took BP at 10am this am. BP at 1050 am was 177/111. Is going to take it again at noon and call us back.

## 2021-10-11 NOTE — Telephone Encounter (Signed)
1204p BP 159/106. Took BP meds at 10am. Per Legrand Como, continue to take BP meds, monitor BP, and follow up here if no improvement after a couple days of new BP meds.

## 2021-11-05 NOTE — Progress Notes (Unsigned)
   Established Patient Office Visit  Subjective   Patient ID: Jodi Cobb, female   DOB: 04-17-1950 Age: 71 y.o. MRN: [id number]   No chief complaint on file.   HPI Pleasant 71 year old female presenting today for follow up on blood pressure. At our last office visit on 10/09/21, her BP was elevated. This was thought to be related to acute stress and grief. With no history of HTN, it was recommended to follow home BP readings and if elevated, return for further evaluation. On 10/10/21, she visited urgent care as her blood pressure was even higher (170s/100s). She was started on Amlodipine '10mg'$  and Chlorthalidone '25mg'$  daily.    Objective:    There were no vitals filed for this visit.  Physical Exam   No results found for this or any previous visit (from the past 24 hour(s)).   {Labs (Optional):23779}  The 10-year ASCVD risk score (Arnett DK, et al., 2019) is: 30.1%   Values used to calculate the score:     Age: 59 years     Sex: Female     Is Non-Hispanic African American: No     Diabetic: No     Tobacco smoker: No     Systolic Blood Pressure: 770 mmHg     Is BP treated: Yes     HDL Cholesterol: 64 mg/dL     Total Cholesterol: 186 mg/dL   Assessment & Plan:   No problem-specific Assessment & Plan notes found for this encounter.   No follow-ups on file.  ___________________________________________ Clearnce Sorrel, DNP, APRN, FNP-BC [facility] and [place]

## 2021-11-06 ENCOUNTER — Ambulatory Visit (INDEPENDENT_AMBULATORY_CARE_PROVIDER_SITE_OTHER): Payer: Medicare Other | Admitting: Medical-Surgical

## 2021-11-06 ENCOUNTER — Ambulatory Visit (HOSPITAL_BASED_OUTPATIENT_CLINIC_OR_DEPARTMENT_OTHER)
Admission: RE | Admit: 2021-11-06 | Discharge: 2021-11-06 | Disposition: A | Discharge status: Home / Self Care | Payer: Medicare Other | Source: Ambulatory Visit | Attending: Medical-Surgical | Admitting: Medical-Surgical

## 2021-11-06 ENCOUNTER — Encounter: Payer: Self-pay | Admitting: Medical-Surgical

## 2021-11-06 ENCOUNTER — Ambulatory Visit (INDEPENDENT_AMBULATORY_CARE_PROVIDER_SITE_OTHER): Payer: Medicare Other

## 2021-11-06 VITALS — BP 127/77 | HR 61 | Resp 20 | Ht 62.0 in | Wt 177.1 lb

## 2021-11-06 DIAGNOSIS — I70219 Atherosclerosis of native arteries of extremities with intermittent claudication, unspecified extremity: Secondary | ICD-10-CM

## 2021-11-06 DIAGNOSIS — R0601 Orthopnea: Secondary | ICD-10-CM

## 2021-11-06 DIAGNOSIS — R0609 Other forms of dyspnea: Secondary | ICD-10-CM

## 2021-11-06 DIAGNOSIS — R079 Chest pain, unspecified: Secondary | ICD-10-CM

## 2021-11-06 DIAGNOSIS — I10 Essential (primary) hypertension: Secondary | ICD-10-CM | POA: Diagnosis not present

## 2021-11-06 DIAGNOSIS — R0602 Shortness of breath: Secondary | ICD-10-CM

## 2021-11-06 DIAGNOSIS — R600 Localized edema: Secondary | ICD-10-CM

## 2021-11-06 LAB — COMPLETE METABOLIC PANEL WITH GFR
AG Ratio: 1.6 (calc) (ref 1.0–2.5)
ALT: 29 U/L (ref 6–29)
AST: 29 U/L (ref 10–35)
Albumin: 4.5 g/dL (ref 3.6–5.1)
Alkaline phosphatase (APISO): 94 U/L (ref 37–153)
BUN: 20 mg/dL (ref 7–25)
CO2: 28 mmol/L (ref 20–32)
Calcium: 9.8 mg/dL (ref 8.6–10.4)
Chloride: 99 mmol/L (ref 98–110)
Creat: 0.73 mg/dL (ref 0.60–1.00)
Globulin: 2.9 g/dL (calc) (ref 1.9–3.7)
Glucose, Bld: 134 mg/dL — ABNORMAL HIGH (ref 65–99)
Potassium: 3.6 mmol/L (ref 3.5–5.3)
Sodium: 138 mmol/L (ref 135–146)
Total Bilirubin: 0.9 mg/dL (ref 0.2–1.2)
Total Protein: 7.4 g/dL (ref 6.1–8.1)
eGFR: 87 mL/min/{1.73_m2} (ref >=60)

## 2021-11-06 LAB — CBC WITH DIFFERENTIAL/PLATELET
Absolute Monocytes: 433 cells/uL (ref 200–950)
Basophils Absolute: 59 cells/uL (ref 0–200)
Basophils Relative: 1.4 %
Eosinophils Absolute: 202 cells/uL (ref 15–500)
Eosinophils Relative: 4.8 %
HCT: 40.7 % (ref 35.0–45.0)
Hemoglobin: 14.2 g/dL (ref 11.7–15.5)
Lymphs Abs: 1873 cells/uL (ref 850–3900)
MCH: 31.7 pg (ref 27.0–33.0)
MCHC: 34.9 g/dL (ref 32.0–36.0)
MCV: 90.8 fL (ref 80.0–100.0)
MPV: 10.8 fL (ref 7.5–12.5)
Monocytes Relative: 10.3 %
Neutro Abs: 1634 cells/uL (ref 1500–7800)
Neutrophils Relative %: 38.9 %
Platelets: 221 10*3/uL (ref 140–400)
RBC: 4.48 10*6/uL (ref 3.80–5.10)
RDW: 12.1 % (ref 11.0–15.0)
Total Lymphocyte: 44.6 %
WBC: 4.2 10*3/uL (ref 3.8–10.8)

## 2021-11-06 LAB — D-DIMER, QUANTITATIVE: D-Dimer, Quant: 0.72 mcg/mL FEU — ABNORMAL HIGH (ref <0.50)

## 2021-11-06 LAB — TROPONIN I: Troponin I: 4 ng/L (ref <=47)

## 2021-11-06 LAB — BRAIN NATRIURETIC PEPTIDE: Brain Natriuretic Peptide: 13 pg/mL (ref <100)

## 2021-11-06 LAB — TSH: TSH: 3.64 mIU/L (ref 0.40–4.50)

## 2021-11-06 MED ORDER — AMLODIPINE BESYLATE 5 MG PO TABS: 5.0000 mg | ORAL_TABLET | Freq: Every day | ORAL | 0 refills | Status: DC

## 2021-11-06 MED ORDER — CHLORTHALIDONE 25 MG PO TABS: 25.0000 mg | ORAL_TABLET | Freq: Every day | ORAL | 0 refills | Status: DC

## 2021-11-06 MED ORDER — CLOTRIMAZOLE-BETAMETHASONE 1-0.05 % EX CREA: TOPICAL_CREAM | CUTANEOUS | 0 refills | Status: AC

## 2021-11-06 MED ORDER — AMLODIPINE BESYLATE 5 MG PO TABS: 5.0000 mg | ORAL_TABLET | Freq: Every day | ORAL | 1 refills | Status: DC

## 2021-11-06 MED ORDER — CHLORTHALIDONE 25 MG PO TABS: 25.0000 mg | ORAL_TABLET | Freq: Every day | ORAL | 1 refills | Status: DC

## 2021-11-06 MED ORDER — IOHEXOL 350 MG/ML SOLN: 75.0000 mL | Freq: Once | INTRAVENOUS | Status: DC | PRN

## 2021-11-06 MED ORDER — IOHEXOL 350 MG/ML SOLN
75.0000 mL | Freq: Once | INTRAVENOUS | Status: AC | PRN
  Administered 2021-11-06: 75 mL via INTRAVENOUS

## 2021-11-06 NOTE — Addendum Note (Signed)
Addended bySamuel Bouche on: 11/06/2021 01:39 PM   Modules accepted: Orders

## 2021-11-19 ENCOUNTER — Ambulatory Visit (HOSPITAL_COMMUNITY): Payer: Medicare Other | Attending: Medical-Surgical

## 2021-11-19 DIAGNOSIS — R079 Chest pain, unspecified: Secondary | ICD-10-CM

## 2021-11-19 DIAGNOSIS — R0609 Other forms of dyspnea: Secondary | ICD-10-CM | POA: Diagnosis present

## 2021-11-19 DIAGNOSIS — R0601 Orthopnea: Secondary | ICD-10-CM | POA: Diagnosis present

## 2021-11-19 DIAGNOSIS — R600 Localized edema: Secondary | ICD-10-CM | POA: Diagnosis present

## 2021-11-19 LAB — ECHOCARDIOGRAM COMPLETE
Area-P 1/2: 3.08 cm2
S' Lateral: 2.7 cm

## 2021-11-20 ENCOUNTER — Ambulatory Visit (INDEPENDENT_AMBULATORY_CARE_PROVIDER_SITE_OTHER): Payer: Medicare Other | Admitting: Medical-Surgical

## 2021-11-20 ENCOUNTER — Encounter: Payer: Self-pay | Admitting: Medical-Surgical

## 2021-11-20 VITALS — BP 131/77 | HR 69 | Resp 20 | Ht 62.0 in | Wt 175.0 lb

## 2021-11-20 DIAGNOSIS — I10 Essential (primary) hypertension: Secondary | ICD-10-CM | POA: Diagnosis not present

## 2021-11-20 DIAGNOSIS — I70219 Atherosclerosis of native arteries of extremities with intermittent claudication, unspecified extremity: Secondary | ICD-10-CM | POA: Diagnosis not present

## 2021-11-20 NOTE — Progress Notes (Signed)
   Established Patient Office Visit  Subjective   Patient ID: Jodi Cobb, female   DOB: 24-Oct-1950 Age: 71 y.o. MRN: [id number]   Chief Complaint  Patient presents with   Follow-up   Hypertension   HPI Pleasant 71 year old female presenting for follow up on HTN.  Since her last appointment she has been doing very well.  She is now taking amlodipine 5 mg daily and chlorthalidone 25 mg daily, tolerating both medications well without side effects.  Notes that her ankle and foot swelling has fully resolved and she is feeling much better with the reduced dose of amlodipine.  Has been monitoring her blood pressure at home and reports her readings have been very good and at goal.  She is requesting that we reroute her referral to vascular to a [facility] provider for further evaluation and monitoring of her bilateral aortic femoral bypass grafts.   Objective:    Vitals:   11/20/21 1619  BP: 131/77  Pulse: 69  Resp: 20  Height: '5\' 2"'$  (1.575 m)  Weight: 175 lb 0.6 oz (79.4 kg)  SpO2: 96%  BMI (Calculated): 32.01    Physical Exam Vitals and nursing note reviewed.  Constitutional:      General: She is not in acute distress.    Appearance: Normal appearance. She is not ill-appearing.  HENT:     Head: Normocephalic and atraumatic.  Cardiovascular:     Rate and Rhythm: Normal rate and regular rhythm.     Pulses: Normal pulses.     Heart sounds: Normal heart sounds.  Pulmonary:     Effort: Pulmonary effort is normal. No respiratory distress.     Breath sounds: Normal breath sounds. No wheezing, rhonchi or rales.  Skin:    General: Skin is warm and dry.  Neurological:     Mental Status: She is alert and oriented to person, place, and time.  Psychiatric:        Mood and Affect: Mood normal.        Behavior: Behavior normal.        Thought Content: Thought content normal.        Judgment: Judgment normal.   No results found for this or any previous visit (from the past 24 hour(s)).      The 10-year ASCVD risk score (Arnett DK, et al., 2019) is: 17.6%   Values used to calculate the score:     Age: 68 years     Sex: Female     Is Non-Hispanic African American: No     Diabetic: No     Tobacco smoker: No     Systolic Blood Pressure: 465 mmHg     Is BP treated: Yes     HDL Cholesterol: 64 mg/dL     Total Cholesterol: 186 mg/dL   Assessment & Plan:   1. Atherosclerosis of native artery of extremity with intermittent claudication, unspecified extremity ([place]) Reentering the referral to vascular surgery per patient request. - Ambulatory referral to Vascular Surgery  2. Benign essential HTN Blood pressure at goal and well-controlled.  Patient tolerating regimen well.  Continue amlodipine 5 mg daily and chlorthalidone 25 mg daily.  Continue monitoring blood pressure at home with goal of 130/80 or less.  Low-sodium diet, regular intentional exercise, and weight loss to healthy weight recommended.  Return for appointment as scheduled in April.  ___________________________________________ Clearnce Sorrel, DNP, APRN, FNP-BC Primary [facility] and [place]

## 2021-11-21 NOTE — Addendum Note (Signed)
Addended by: Narda Rutherford on: 11/21/2021 03:19 PM   Modules accepted: Orders

## 2021-11-24 ENCOUNTER — Ambulatory Visit (HOSPITAL_COMMUNITY)
Admission: RE | Admit: 2021-11-24 | Discharge: 2021-11-24 | Disposition: A | Discharge status: Home / Self Care | Payer: Medicare Other | Source: Ambulatory Visit | Attending: Surgery | Admitting: Surgery

## 2021-11-24 ENCOUNTER — Other Ambulatory Visit: Payer: Self-pay | Admitting: *Deleted

## 2021-11-24 DIAGNOSIS — I70213 Atherosclerosis of native arteries of extremities with intermittent claudication, bilateral legs: Secondary | ICD-10-CM

## 2021-11-26 ENCOUNTER — Encounter: Payer: Self-pay | Admitting: Vascular Surgery

## 2021-11-26 ENCOUNTER — Ambulatory Visit (INDEPENDENT_AMBULATORY_CARE_PROVIDER_SITE_OTHER): Payer: Medicare Other | Admitting: Vascular Surgery

## 2021-11-26 VITALS — BP 160/89 | HR 73 | Temp 97.2°F | Resp 18 | Ht 62.0 in | Wt 175.5 lb

## 2021-11-26 DIAGNOSIS — I739 Peripheral vascular disease, unspecified: Secondary | ICD-10-CM

## 2021-11-26 NOTE — Progress Notes (Signed)
Patient ID: Jodi Cobb, female   DOB: [birth year], 71 y.o.   MRN: [id number]  Reason for Consult: New Patient (Initial Visit) (Recurrent claudication symptoms )   Referred by Samuel Bouche, NP  Subjective:     HPI:  Jodi Cobb is a 71 y.o. 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  She had quit smoking in 2007.  At the time she was unable to walk due to pain in her legs.  Since that time she has been very active and non-smoking.  No other previous vascular procedures.  She does take aspirin daily.  Denies any history of stroke, TIA or amaurosis.  She states that she recently became concerned because of cramping in her legs particularly at night.  Does not have any tissue loss or ulceration.  Past Medical History:  Diagnosis Date   Cancer of skin of upper arm    Colon polyps    High cholesterol    Skin cancer of nose    Family History  Problem Relation Age of Onset   Alzheimer's disease Mother    Pancreatic cancer Father    Lung cancer Sister    Past Surgical History:  Procedure Laterality Date   ADRENALECTOMY Right 2007   AORTIC/RENAL BYPASS  2009   Broken Bone Repair Left    ECTOPIC PREGNANCY SURGERY  1976   HERNIA REPAIR Right 2011   large ventral incisional hernia    SKIN CANCER EXCISION     VAGINAL HYSTERECTOMY      Short Social History:  Social History   Tobacco Use   Smoking status: Former    Packs/day: 2.00    Types: Cigarettes    Quit date: 1992    Years since quitting: 31.9   Smokeless tobacco: Never  Substance Use Topics   Alcohol use: Not Currently    Allergies  Allergen Reactions   Penicillins Hives and Itching    Current Outpatient Medications  Medication Sig Dispense Refill   amLODipine (NORVASC) 5 MG tablet Take 1 tablet (5 mg total) by mouth daily. 90 tablet 1   aspirin EC 81 MG tablet Take 81 mg by mouth daily. Swallow whole.     Calcium Carbonate-Vit D-Min (CALCIUM 1200 PO) Take by mouth.      chlorthalidone (HYGROTON) 25 MG tablet Take 1 tablet (25 mg total) by mouth daily. 90 tablet 1   clotrimazole-betamethasone (LOTRISONE) cream Apply topically twice daily to the affected area for up to 14 days. 45 g 0   Cyanocobalamin (B-12) 2500 MCG TABS      docusate sodium (COLACE) 100 MG capsule Take 100 mg by mouth 2 (two) times daily.     latanoprost (XALATAN) 0.005 % ophthalmic solution 1 drop at bedtime.     lovastatin (MEVACOR) 20 MG tablet Take 1 tablet (20 mg total) by mouth daily. 90 tablet 1   Multiple Vitamin (MULTIVITAMIN) tablet Take 1 tablet by mouth daily.     Omega-3 Fatty Acids (FISH OIL) 1000 MG CAPS Take by mouth.     Probiotic Product (PROBIOTIC-10 PO) Take by mouth.     vitamin C (ASCORBIC ACID) 500 MG tablet Take 500 mg by mouth daily.     VITAMIN D, CHOLECALCIFEROL, PO Take by mouth.     No current facility-administered medications for this visit.    Review of Systems  Constitutional:  Constitutional negative. HENT: HENT negative.  Eyes: Eyes negative.  Respiratory: Respiratory negative.  Cardiovascular: Cardiovascular negative.  GI: Gastrointestinal negative.  Musculoskeletal:       Leg cramping Skin: Skin negative.  Neurological: Neurological negative. Hematologic: Hematologic/lymphatic negative.        Objective:  Objective   Vitals:   11/26/21 1343  BP: (!) 160/89  Pulse: 73  Resp: 18  Temp: (!) 97.2 F (36.2 C)  TempSrc: Temporal  SpO2: 94%  Weight: 175 lb 8 oz (79.6 kg)  Height: '5\' 2"'$  (1.575 m)   Body mass index is 32.1 kg/m.  Physical Exam HENT:     Head: Normocephalic.     Nose: Nose normal.  Eyes:     Pupils: Pupils are equal, round, and reactive to light.  Neck:     Vascular: No carotid bruit.  Cardiovascular:     Rate and Rhythm: Normal rate.     Pulses: Normal pulses.  Pulmonary:     Effort: Pulmonary effort is normal.  Abdominal:     General: Abdomen is flat.     Palpations: Abdomen is soft. There is no mass.   Musculoskeletal:        General: Normal range of motion.     Cervical back: No tenderness.     Right lower leg: No edema.     Left lower leg: No edema.  Skin:    Capillary Refill: Capillary refill takes less than 2 seconds.  Neurological:     General: No focal deficit present.     Mental Status: She is alert.  Psychiatric:        Mood and Affect: Mood normal.        Behavior: Behavior normal.        Thought Content: Thought content normal.        Judgment: Judgment normal.     Data: ABI Findings:  +---------+------------------+-----+---------+--------+  Right   Rt Pressure (mmHg)IndexWaveform Comment   +---------+------------------+-----+---------+--------+  Brachial 149                                       +---------+------------------+-----+---------+--------+  PTA     198               1.33 triphasic          +---------+------------------+-----+---------+--------+  DP      202               1.36 biphasic           +---------+------------------+-----+---------+--------+  Great Toe142               0.95 Normal             +---------+------------------+-----+---------+--------+   +---------+------------------+-----+---------+-------+  Left    Lt Pressure (mmHg)IndexWaveform Comment  +---------+------------------+-----+---------+-------+  Brachial 148                                      +---------+------------------+-----+---------+-------+  PTA     199               1.34 triphasic         +---------+------------------+-----+---------+-------+  DP      186               1.25 biphasic          +---------+------------------+-----+---------+-------+  Great Toe117               0.[place]  Normal            +---------+------------------+-----+---------+-------+   +-------+-----------+-----------+------------+------------+  ABI/TBIToday's ABIToday's TBIPrevious ABIPrevious TBI   +-------+-----------+-----------+------------+------------+  Right 1.36       0.95                                 +-------+-----------+-----------+------------+------------+  Left  1.34       0.79                                 +-------+-----------+-----------+------------+------------+           Summary:  Right: Resting right ankle-brachial index is within normal range.   Left: Resting left ankle-brachial index is within normal range. The left  toe-brachial index is normal.   We reviewed her recent CT angio of her chest for PE which demonstrated mildly enlarged ascending aortic aneurysm.      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  Plan will be to follow-up in 2 years with repeat ABIs.     Waynetta Sandy MD [facility]

## 2022-03-30 ENCOUNTER — Other Ambulatory Visit: Payer: Self-pay | Admitting: Medical-Surgical

## 2022-04-06 ENCOUNTER — Telehealth: Payer: Self-pay | Admitting: Medical-Surgical

## 2022-04-06 NOTE — Telephone Encounter (Signed)
[facility] to schedule their annual wellness visit. Appointment made for 04/22/2022 at [place] Patient [facility] Direct Dial: [phone]

## 2022-04-07 ENCOUNTER — Other Ambulatory Visit: Payer: Self-pay | Admitting: Medical-Surgical

## 2022-04-10 ENCOUNTER — Ambulatory Visit: Payer: Medicare Other | Admitting: Medical-Surgical

## 2022-04-14 ENCOUNTER — Ambulatory Visit (INDEPENDENT_AMBULATORY_CARE_PROVIDER_SITE_OTHER): Payer: Medicare Other | Admitting: Medical-Surgical

## 2022-04-14 ENCOUNTER — Encounter: Payer: Self-pay | Admitting: Medical-Surgical

## 2022-04-14 VITALS — BP 131/79 | HR 66 | Resp 20 | Ht 62.0 in | Wt 178.3 lb

## 2022-04-14 DIAGNOSIS — R7301 Impaired fasting glucose: Secondary | ICD-10-CM | POA: Insufficient documentation

## 2022-04-14 DIAGNOSIS — I10 Essential (primary) hypertension: Secondary | ICD-10-CM

## 2022-04-14 DIAGNOSIS — E6609 Other obesity due to excess calories: Secondary | ICD-10-CM | POA: Diagnosis not present

## 2022-04-14 DIAGNOSIS — Z780 Asymptomatic menopausal state: Secondary | ICD-10-CM | POA: Insufficient documentation

## 2022-04-14 DIAGNOSIS — Z6832 Body mass index (BMI) 32.0-32.9, adult: Secondary | ICD-10-CM

## 2022-04-14 MED ORDER — AMLODIPINE BESYLATE 5 MG PO TABS: 5.0000 mg | ORAL_TABLET | Freq: Every day | ORAL | 3 refills | Status: DC

## 2022-04-14 MED ORDER — LOVASTATIN 20 MG PO TABS: 20.0000 mg | ORAL_TABLET | Freq: Every day | ORAL | 3 refills | Status: DC

## 2022-04-14 MED ORDER — CHLORTHALIDONE 25 MG PO TABS: 25.0000 mg | ORAL_TABLET | Freq: Every day | ORAL | 3 refills | Status: DC

## 2022-04-14 NOTE — Assessment & Plan Note (Signed)
Taking Amlodipine 5mg  and Chlorthalidone 25mg  daily, tolerating well without side effects. Checking BP at home with readings similar to today. Following a low fat heart healthy diet. Staying physically active. On exam, HRR, normal S1/S2. No peripheral edema. Respirations regular, unlabored. Lungs CTA.  No CP, SOB, palpitations, vision changes, HAs, dizziness, or edema. BP at goal. Continue current medications. Labs current but will be due for recheck soon. Orders entered today so she can have them drawn in mid-June at her convenience.

## 2022-04-14 NOTE — Assessment & Plan Note (Signed)
Recent history of elevated fasting glucose. Checking A1c.

## 2022-04-14 NOTE — Assessment & Plan Note (Signed)
Discussed recommendations for weight loss to help benefit her overall health. Recommend regular intentional exercise with a low calorie diet.

## 2022-04-14 NOTE — Progress Notes (Signed)
        Established patient visit  History, exam, impression, and plan:  Benign essential HTN Taking Amlodipine 5mg  and Chlorthalidone 25mg  daily, tolerating well without side effects. Checking BP at home with readings similar to today. Following a low fat heart healthy diet. Staying physically active. On exam, HRR, normal S1/S2. No peripheral edema. Respirations regular, unlabored. Lungs CTA.  No CP, SOB, palpitations, vision changes, HAs, dizziness, or edema. BP at goal. Continue current medications. Labs current but will be due for recheck soon. Orders entered today so she can have them drawn in mid-June at her convenience.   Impaired fasting glucose Recent history of elevated fasting glucose. Checking A1c.   Class 1 obesity due to excess calories with serious comorbidity and body mass index (BMI) of 32.0 to 32.9 in adult Discussed recommendations for weight loss to help benefit her overall health. Recommend regular intentional exercise with a low calorie diet.   Postmenopausal Due for DEXA scan. Ordered today.   Procedures performed this visit: None.  Return in about 6 months (around 10/14/2022) for chronic disease follow up.  __________________________________ Thayer Ohm, DNP, APRN, FNP-BC Primary [facility] and [facility]

## 2022-04-14 NOTE — Assessment & Plan Note (Signed)
Due for DEXA scan. Ordered today. 

## 2022-04-22 ENCOUNTER — Ambulatory Visit (INDEPENDENT_AMBULATORY_CARE_PROVIDER_SITE_OTHER): Payer: Medicare Other | Admitting: Family Medicine

## 2022-04-22 DIAGNOSIS — Z0000 Encounter for general adult medical examination without abnormal findings: Secondary | ICD-10-CM | POA: Diagnosis not present

## 2022-04-22 NOTE — Patient Instructions (Addendum)
MEDICARE ANNUAL WELLNESS VISIT Health Maintenance Summary and Written Plan of Care  Jodi Cobb ,  Thank you for allowing me to perform your Medicare Annual Wellness Visit and for your ongoing commitment to your health.   Health Maintenance & Immunization History Health Maintenance  Topic Date Due   DTaP/Tdap/Td (1 - Tdap) Never done   COVID-19 Vaccine (8 - 2023-24 season) 04/30/2022 (Originally 12/11/2021)   Zoster Vaccines- Shingrix (1 of 2) 07/22/2022 (Originally 05/05/1998)   DEXA SCAN  04/22/2023 (Originally 05/04/2013)   INFLUENZA VACCINE  08/06/2022   Medicare Annual Wellness (AWV)  04/22/2023   MAMMOGRAM  08/29/2023   COLONOSCOPY (Pts 45-8yrs Insurance coverage will need to be confirmed)  08/30/2029   Pneumonia Vaccine 43+ Years old  Completed   HPV VACCINES  Aged Out   Hepatitis C Screening  Discontinued   Immunization History  Administered Date(s) Administered   Influenza Split 08/20/2013, 09/27/2014   Influenza-Unspecified 10/14/2016, 10/01/2017, 09/23/2020, 10/05/2021   Moderna SARS-COV2 Booster Vaccination 04/12/2020   Moderna Sars-Covid-2 Vaccination 03/01/2019, 03/29/2019, 11/09/2019, 04/12/2020, 10/16/2021   Pfizer Covid-19 Vaccine Bivalent Booster 79yrs & up 09/30/2020   Pneumococcal Conjugate-13 01/20/2014   Pneumococcal Polysaccharide-23 01/28/2015, 10/01/2017    These are the patient goals that we discussed:  Goals Addressed               This Visit's Progress     Patient Stated (pt-stated)        Patient stated that she would like to loose weight.         This is a list of Health Maintenance Items that are overdue or due now: Health Maintenance Due  Topic Date Due   DTaP/Tdap/Td (1 - Tdap) Never done   Td vaccine Shingles vaccine Bone density scan- scheduled for 05/13/22.  Orders/Referrals Placed Today: No orders of the defined types were placed in this encounter.  (Contact our referral department at [phone] if you have not spoken  with someone about your referral appointment within the next 5 days)    Follow-up Plan Follow-up with Christen Butter, NP as planned Schedule shingles vaccine and tetanus vaccine at the pharmacy.  Medicare wellness visit in one year.  Patient will access AVS on my chart.      Health Maintenance, Female Adopting a healthy lifestyle and getting preventive care are important in promoting health and wellness. Ask your health care provider about: The right schedule for you to have regular tests and exams. Things you can do on your own to prevent diseases and keep yourself healthy. What should I know about diet, weight, and exercise? Eat a healthy diet  Eat a diet that includes plenty of vegetables, fruits, low-fat dairy products, and lean protein. Do not eat a lot of foods that are high in solid fats, added sugars, or sodium. Maintain a healthy weight Body mass index (BMI) is used to identify weight problems. It estimates body fat based on height and weight. Your health care provider can help determine your BMI and help you achieve or maintain a healthy weight. Get regular exercise Get regular exercise. This is one of the most important things you can do for your health. Most adults should: Exercise for at least 150 minutes each week. The exercise should increase your heart rate and make you sweat (moderate-intensity exercise). Do strengthening exercises at least twice a week. This is in addition to the moderate-intensity exercise. Spend less time sitting. Even light physical activity can be beneficial. Watch cholesterol and blood lipids Have  your blood tested for lipids and cholesterol at 74 years of age, then have this test every 5 years. Have your cholesterol levels checked more often if: Your lipid or cholesterol levels are high. You are older than 74 years of age. You are at high risk for heart disease. What should I know about cancer screening? Depending on your health history and  family history, you may need to have cancer screening at various ages. This may include screening for: Breast cancer. Cervical cancer. Colorectal cancer. Skin cancer. Lung cancer. What should I know about heart disease, diabetes, and high blood pressure? Blood pressure and heart disease High blood pressure causes heart disease and increases the risk of stroke. This is more likely to develop in people who have high blood pressure readings or are overweight. Have your blood pressure checked: Every 3-5 years if you are 61-14 years of age. Every year if you are 61 years old or older. Diabetes Have regular diabetes screenings. This checks your fasting blood sugar level. Have the screening done: Once every three years after age 83 if you are at a normal weight and have a low risk for diabetes. More often and at a younger age if you are overweight or have a high risk for diabetes. What should I know about preventing infection? Hepatitis B If you have a higher risk for hepatitis B, you should be screened for this virus. Talk with your health care provider to find out if you are at risk for hepatitis B infection. Hepatitis C Testing is recommended for: Everyone born from [birth year] through 1967. Anyone with known risk factors for hepatitis C. Sexually transmitted infections (STIs) Get screened for STIs, including gonorrhea and chlamydia, if: You are sexually active and are younger than 74 years of age. You are older than 74 years of age and your health care provider tells you that you are at risk for this type of infection. Your sexual activity has changed since you were last screened, and you are at increased risk for chlamydia or gonorrhea. Ask your health care provider if you are at risk. Ask your health care provider about whether you are at high risk for HIV. Your health care provider may recommend a prescription medicine to help prevent HIV infection. If you choose to take medicine to prevent HIV,  you should first get tested for HIV. You should then be tested every 3 months for as long as you are taking the medicine. Pregnancy If you are about to stop having your period (premenopausal) and you may become pregnant, seek counseling before you get pregnant. Take 400 to 800 micrograms (mcg) of folic acid every day if you become pregnant. Ask for birth control (contraception) if you want to prevent pregnancy. Osteoporosis and menopause Osteoporosis is a disease in which the bones lose minerals and strength with aging. This can result in bone fractures. If you are 49 years old or older, or if you are at risk for osteoporosis and fractures, ask your health care provider if you should: Be screened for bone loss. Take a calcium or vitamin D supplement to lower your risk of fractures. Be given hormone replacement therapy (HRT) to treat symptoms of menopause. Follow these instructions at home: Alcohol use Do not drink alcohol if: Your health care provider tells you not to drink. You are pregnant, may be pregnant, or are planning to become pregnant. If you drink alcohol: Limit how much you have to: 0-1 drink a day. Know how much alcohol is  in your drink. In the [place] one drink equals one 12 oz bottle of beer (355 mL), one 5 oz glass of wine (148 mL), or one 1 oz glass of hard liquor (44 mL). Lifestyle Do not use any products that contain nicotine or tobacco. These products include cigarettes, chewing tobacco, and vaping devices, such as e-cigarettes. If you need help quitting, ask your health care provider. Do not use street drugs. Do not share needles. Ask your health care provider for help if you need support or information about quitting drugs. General instructions Schedule regular health, dental, and eye exams. Stay current with your vaccines. Tell your health care provider if: You often feel depressed. You have ever been abused or do not feel safe at home. Summary Adopting a healthy  lifestyle and getting preventive care are important in promoting health and wellness. Follow your health care provider's instructions about healthy diet, exercising, and getting tested or screened for diseases. Follow your health care provider's instructions on monitoring your cholesterol and blood pressure. This information is not intended to replace advice given to you by your health care provider. Make sure you discuss any questions you have with your health care provider. Document Revised: 05/13/2020 Document Reviewed: 05/13/2020 Elsevier Patient Education  2023 [employer].

## 2022-04-22 NOTE — Progress Notes (Signed)
MEDICARE ANNUAL WELLNESS VISIT  04/22/2022  Telephone Visit Disclaimer This Medicare AWV was conducted by telephone Cobb to national recommendations for restrictions regarding the COVID-19 Pandemic (e.g. social distancing).  I verified, using two identifiers, that I am speaking with Jodi Cobb or their authorized healthcare agent. I discussed the limitations, risks, security, and privacy concerns of performing an evaluation and management service by telephone and the potential availability of an in-person appointment in the future. The patient expressed understanding and agreed to proceed.  Location of Patient: Home Location of Provider (nurse):  In the office.  Subjective:    Jodi Cobb is a 72 y.o. female patient of Christen Butter, NP who had a Medicare Annual Wellness Visit today via telephone. Jodi Cobb is Retired and lives alone. she has 1 child. she reports that she is socially active and does interact with friends/family regularly. she is minimally physically active and enjoys playing word games on her tablet.  Patient Care Team: Christen Butter, NP as PCP - General (Nurse Practitioner)     04/22/2022    1:01 PM 07/10/2020    3:29 PM  Advanced Directives  Does Patient Have a Medical Advance Directive? Yes No  Type of Advance Directive Living will Living will;Healthcare Power of Attorney  Does patient want to make changes to medical advance directive? No - Patient declined No - Patient declined  Copy of Healthcare Power of Attorney in Chart?  No - copy requested    Hospital Utilization Over the Past 12 Months: # of hospitalizations or ER visits: 0 # of surgeries: 0  Review of Systems    Patient reports that her overall health is unchanged compared to last year.  History obtained from chart review and the patient  Patient Reported Readings (BP, Pulse, CBG, Weight, etc) none  Pain Assessment Pain : No/denies pain     Current Medications & Allergies  (verified) Allergies as of 04/22/2022       Reactions   Penicillins Hives, Itching        Medication List        Accurate as of April 22, 2022  1:08 PM. If you have any questions, ask your nurse or doctor.          amLODipine 5 MG tablet Commonly known as: NORVASC Take 1 tablet (5 mg total) by mouth daily.   ascorbic acid 500 MG tablet Commonly known as: VITAMIN C Take 500 mg by mouth daily.   aspirin EC 81 MG tablet Take 81 mg by mouth daily. Swallow whole.   B-12 2500 MCG Tabs   CALCIUM 1200 PO Take by mouth.   chlorthalidone 25 MG tablet Commonly known as: HYGROTON Take 1 tablet (25 mg total) by mouth daily.   clotrimazole-betamethasone cream Commonly known as: LOTRISONE Apply topically twice daily to the affected area for up to 14 days.   docusate sodium 100 MG capsule Commonly known as: COLACE Take 100 mg by mouth 2 (two) times daily.   Fish Oil 1000 MG Caps Take by mouth.   latanoprost 0.005 % ophthalmic solution Commonly known as: XALATAN 1 drop at bedtime.   lovastatin 20 MG tablet Commonly known as: MEVACOR Take 1 tablet (20 mg total) by mouth daily.   multivitamin tablet Take 1 tablet by mouth daily.   PROBIOTIC-10 PO Take by mouth.   VITAMIN D (CHOLECALCIFEROL) PO Take by mouth.        History (reviewed): Past Medical History:  Diagnosis Date   Allergy  1973   Anxiety 01/02/2020   Arthritis 05/05/2010   Blood transfusion without reported diagnosis 05/04/2005   Cancer of skin of upper arm    Cataract 2022   Colon polyps    GERD (gastroesophageal reflux disease) 05/05/2010   High cholesterol    Skin cancer of nose    Past Surgical History:  Procedure Laterality Date   ABDOMINAL HYSTERECTOMY  1999   ADRENALECTOMY Right 2007   AORTIC/RENAL BYPASS  2009   Broken Bone Repair Left    ECTOPIC PREGNANCY SURGERY  1976   HERNIA REPAIR Right 2011   large ventral incisional hernia    SKIN CANCER EXCISION     VAGINAL  HYSTERECTOMY     Family History  Problem Relation Age of Onset   Alzheimer's disease Mother    Anxiety disorder Mother    Cancer Mother    Pancreatic cancer Father    Cancer Father    COPD Father    Hearing loss Father    Lung cancer Sister    Alcohol abuse Sister    Cancer Sister    Early death Sister    Cancer Maternal Grandmother    Arthritis Sister    Arthritis Brother    Cancer Son    Cancer Paternal Uncle    Social History   Socioeconomic History   Marital status: Widowed    Spouse name: John   Number of children: 1   Years of education: 12   Highest education level: Some college, no degree  Occupational History   Occupation: Retired  Tobacco Use   Smoking status: Former    Packs/day: 2    Types: Cigarettes    Quit date: 01/06/1995    Years since quitting: 27.3   Smokeless tobacco: Never  Vaping Use   Vaping Use: Never used  Substance and Sexual Activity   Alcohol use: Yes    Comment: Occasionally a glass of wine   Drug use: Never   Sexual activity: Not Currently    Partners: Male    Birth control/protection: Abstinence  Other Topics Concern   Not on file  Social History Narrative   Lives alone. She has one child. She enjoys playing word games on her tablet.   Social Determinants of Health   Financial Resource Strain: Low Risk  (04/18/2022)   Overall Financial Resource Strain (CARDIA)    Difficulty of Paying Living Expenses: Not hard at all  Food Insecurity: No Food Insecurity (04/18/2022)   Hunger Vital Sign    Worried About Running Out of Food in the Last Year: Never true    Ran Out of Food in the Last Year: Never true  Transportation Needs: No Transportation Needs (04/18/2022)   PRAPARE - Administrator, Civil Service (Medical): No    Lack of Transportation (Non-Medical): No  Physical Activity: Sufficiently Active (04/18/2022)   Exercise Vital Sign    Days of Exercise per Week: 5 days    Minutes of Exercise per Session: 90 min   Stress: No Stress Concern Present (04/18/2022)   [employer] of Occupational Health - Occupational Stress Questionnaire    Feeling of Stress : Not at all  Social Connections: Socially Isolated (04/22/2022)   Social Connection and Isolation Panel [NHANES]    Frequency of Communication with Friends and Family: More than three times a week    Frequency of Social Gatherings with Friends and Family: Three times a week    Attends Religious Services: Never    Active  Member of Clubs or Organizations: No    Attends Banker Meetings: Never    Marital Status: Widowed    Activities of Daily Living    04/18/2022    1:50 PM 07/12/2021   11:55 AM  In your present state of health, do you have any difficulty performing the following activities:  Hearing? 0 0  Vision? 0 0  Difficulty concentrating or making decisions? 0 0  Walking or climbing stairs? 0 0  Dressing or bathing? 0 0  Doing errands, shopping? 0 0  Preparing Food and eating ? N N  Using the Toilet? N N  In the past six months, have you accidently leaked urine? Y N  Do you have problems with loss of bowel control? N N  Managing your Medications? N N  Managing your Finances? N N  Housekeeping or managing your Housekeeping? N N    Patient Education/ Literacy How often do you need to have someone help you when you read instructions, pamphlets, or other written materials from your doctor or pharmacy?: 1 - Never What is the last grade level you completed in school?: Trade school  Exercise Current Exercise Habits: Home exercise routine, Type of exercise: walking, Time (Minutes): > 60, Frequency (Times/Week): 5, Weekly Exercise (Minutes/Week): 0, Intensity: Moderate, Exercise limited by: None identified  Diet Patient reports consuming 2 meals a day and 2 snack(s) a day Patient reports that her primary diet is: Regular Patient reports that she does have regular access to food.   Depression Screen    04/22/2022    1:01  PM 11/06/2021    8:18 AM 10/09/2021    1:28 PM 10/09/2021    1:19 PM 10/07/2020    3:41 PM 07/10/2020    3:30 PM 04/03/2020    2:26 PM  PHQ 2/9 Scores  PHQ - 2 Score 0 0 0 0 6 3 1   PHQ- 9 Score     18 6 4      Fall Risk    04/22/2022    1:01 PM 04/18/2022    1:50 PM 07/12/2021   11:55 AM 10/07/2020    2:55 PM 07/10/2020    3:29 PM  Fall Risk   Falls in the past year? 1 1 0 0 1  Number falls in past yr: 1 1 0 0 1  Injury with Fall? 0 0 0 0 0  Risk for fall Cobb to : History of fall(s)    History of fall(s)  Follow up Falls evaluation completed;Education provided;Falls prevention discussed   Falls evaluation completed Falls evaluation completed;Education provided;Falls prevention discussed     Objective:  Jodi Cobb seemed alert and oriented and she participated appropriately during our telephone visit.  Blood Pressure Weight BMI  BP Readings from Last 3 Encounters:  04/14/22 131/79  11/26/21 (!) 160/89  11/20/21 131/77   Wt Readings from Last 3 Encounters:  04/14/22 178 lb 4.8 oz (80.9 kg)  11/26/21 175 lb 8 oz (79.6 kg)  11/20/21 175 lb 0.6 oz (79.4 kg)   BMI Readings from Last 1 Encounters:  04/14/22 32.61 kg/m    *Unable to obtain current vital signs, weight, and BMI Cobb to telephone visit type  Hearing/Vision  Jamae did not seem to have difficulty with hearing/understanding during the telephone conversation Reports that she has had a formal eye exam by an eye care professional within the past year Reports that she has not had a formal hearing evaluation within the past year *Unable to fully  assess hearing and vision during telephone visit type  Cognitive Function:    04/22/2022    1:04 PM 07/10/2020    3:42 PM  6CIT Screen  What Year? 0 points 0 points  What month? 0 points 0 points  What time? 0 points 0 points  Count back from 20 0 points 0 points  Months in reverse 0 points 0 points  Repeat phrase 0 points 0 points  Total Score 0 points 0 points    (Normal:0-7, Significant for Dysfunction: >8)  Normal Cognitive Function Screening: Yes   Immunization & Health Maintenance Record Immunization History  Administered Date(s) Administered   Influenza Split 08/20/2013, 09/27/2014   Influenza-Unspecified 10/14/2016, 10/01/2017, 09/23/2020, 10/05/2021   Moderna SARS-COV2 Booster Vaccination 04/12/2020   Moderna Sars-Covid-2 Vaccination 03/01/2019, 03/29/2019, 11/09/2019, 04/12/2020, 10/16/2021   Pfizer Covid-19 Vaccine Bivalent Booster 12yrs & up 09/30/2020   Pneumococcal Conjugate-13 01/20/2014   Pneumococcal Polysaccharide-23 01/28/2015, 10/01/2017    Health Maintenance  Topic Date Cobb   DTaP/Tdap/Td (1 - Tdap) Never done   COVID-19 Vaccine (8 - 2023-24 season) 04/30/2022 (Originally 12/11/2021)   Zoster Vaccines- Shingrix (1 of 2) 07/22/2022 (Originally 05/05/1998)   DEXA SCAN  04/22/2023 (Originally 05/04/2013)   INFLUENZA VACCINE  08/06/2022   Medicare Annual Wellness (AWV)  04/22/2023   MAMMOGRAM  08/29/2023   COLONOSCOPY (Pts 45-62yrs Insurance coverage will need to be confirmed)  08/30/2029   Pneumonia Vaccine 34+ Years old  Completed   HPV VACCINES  Aged Out   Hepatitis C Screening  Discontinued       Assessment  This is a routine wellness examination for [employer].  Health Maintenance: Cobb or Overdue Health Maintenance Cobb  Topic Date Cobb   DTaP/Tdap/Td (1 - Tdap) Never done    Jodi Cobb does not need a referral for Community Assistance: Care Management:   no Social Work:    no Prescription Assistance:  no Nutrition/Diabetes Education:  no   Plan:  Personalized Goals  Goals Addressed               This Visit's Progress     Patient Stated (pt-stated)        Patient stated that she would like to loose weight.       Personalized Health Maintenance & Screening Recommendations  Td vaccine Shingles vaccine Bone density scan- scheduled for 05/13/22.  Lung Cancer Screening Recommended:  no (Low Dose CT Chest recommended if Age 4-80 years, 30 pack-year currently smoking OR have quit w/in past 15 years) Hepatitis C Screening recommended: no HIV Screening recommended: no  Advanced Directives: Written information was not prepared per patient's request.  Referrals & Orders No orders of the defined types were placed in this encounter.   Follow-up Plan Follow-up with Christen Butter, NP as planned Schedule shingles vaccine and tetanus vaccine at the pharmacy.  Medicare wellness visit in one year.  Patient will access AVS on my chart.   I have personally reviewed and noted the following in the patient's chart:   Medical and social history Use of alcohol, tobacco or illicit drugs  Current medications and supplements Functional ability and status Nutritional status Physical activity Advanced directives List of other physicians Hospitalizations, surgeries, and ER visits in previous 12 months Vitals Screenings to include cognitive, depression, and falls Referrals and appointments  In addition, I have reviewed and discussed with Jodi Cobb certain preventive protocols, quality metrics, and best practice recommendations. A written personalized care plan for preventive services as  well as general preventive health recommendations is available and can be mailed to the patient at her request.      Modesto Charon, RN BSN  04/22/2022

## 2022-05-13 ENCOUNTER — Ambulatory Visit (INDEPENDENT_AMBULATORY_CARE_PROVIDER_SITE_OTHER): Payer: Medicare Other

## 2022-05-13 DIAGNOSIS — Z780 Asymptomatic menopausal state: Secondary | ICD-10-CM | POA: Diagnosis not present

## 2022-05-26 ENCOUNTER — Telehealth: Payer: Self-pay | Admitting: Medical-Surgical

## 2022-05-26 ENCOUNTER — Encounter: Payer: Self-pay | Admitting: Medical-Surgical

## 2022-05-26 ENCOUNTER — Ambulatory Visit (INDEPENDENT_AMBULATORY_CARE_PROVIDER_SITE_OTHER): Payer: Medicare Other | Admitting: Medical-Surgical

## 2022-05-26 ENCOUNTER — Ambulatory Visit (INDEPENDENT_AMBULATORY_CARE_PROVIDER_SITE_OTHER): Payer: Medicare Other

## 2022-05-26 VITALS — BP 134/83 | HR 65 | Resp 20 | Ht 62.0 in | Wt 177.1 lb

## 2022-05-26 DIAGNOSIS — R55 Syncope and collapse: Secondary | ICD-10-CM

## 2022-05-26 DIAGNOSIS — S99911A Unspecified injury of right ankle, initial encounter: Secondary | ICD-10-CM

## 2022-05-26 DIAGNOSIS — I10 Essential (primary) hypertension: Secondary | ICD-10-CM | POA: Diagnosis not present

## 2022-05-26 DIAGNOSIS — M25562 Pain in left knee: Secondary | ICD-10-CM

## 2022-05-26 DIAGNOSIS — R7301 Impaired fasting glucose: Secondary | ICD-10-CM

## 2022-05-26 MED ORDER — TRAMADOL HCL 50 MG PO TABS: 50.0000 mg | ORAL_TABLET | Freq: Three times a day (TID) | ORAL | 0 refills | Status: AC | PRN

## 2022-05-26 NOTE — Progress Notes (Signed)
        Established patient visit  History, exam, impression, and plan:  1. Syncope and collapse Jodi Cobb 72 year old female presenting today with reports of an episode that occurred approximately 5 days ago when she was outside mowing the lawn.  She had been riding the lawn more and had stopped to clean up an area.  She ended up sitting down and when she tried to get up, she developed severe dizziness along with nausea.  She tried to make it back into the house but ended up having to lay down on the garage floor.  She continued to have significant dizziness and nausea whenever she tried to get up and ended up laying there for 45 minutes before she was able to finally crawl into the house.  Notes that she was diaphoretic and weak.  She tried to check her blood pressure once she was in the house but Getting an error message on her blood pressure machine.  She was able to get into the shower and after that went to bed and slept for quite a few hours.  Has never had an episode like this before.  Denies chest pain and difficulty breathing.  On exam, HRR, S1/S2 normal.  Lungs CTA.  Respirations even and unlabored.  Mild edema of the right ankle as noted below.  Negative for JVD and carotid bruit/thrill.  Unclear etiology.  Checking labs as below.  Plan for bilateral carotid ultrasound due to syncopal episode.  In office EKG showing sinus bradycardia with ST and T wave abnormality.  No changes from EKG performed in November 2023.  Echocardiogram from November 2023 on file with no significant concerns. - CBC with Differential/Platelet - CMP14+EGFR - US Carotid Bilateral; Future - Troponin T  2. Injury of right ankle, initial encounter When she awoke from her episode noted above, she found that she had right ankle swelling along with bruising, pain, and tenderness.  Unable to bear full weight and is using a cane for ambulation.  On exam, tenderness along the lateral malleolus in the area of bruising with  limited range of motion due to pain.  Getting x-rays today.  Recommend conservative measures with RICE.  Adding tramadol 50 mg 3 times daily as needed for pain. - DG Ankle Complete Right; Future  3. Acute pain of left knee Also having some acute knee pain after her episode on the left side.  Pain is located medially both above and below the patella.  Originally has some clicking and popping in the joint but after 24 hours or so this resolved.  Getting x-rays today.  Adding tramadol as above.  Continue conservative measures. - DG Knee Complete 4 Views Left; Future  4. Impaired fasting glucose History of impaired fasting glucose.  Checking hemoglobin A1c. - Hemoglobin A1c  5. Benign essential HTN Blood pressure significantly elevated on arrival.  She is compliant with her medications and is taking amlodipine 5 mg and chlorthalidone 25 mg daily as prescribed.  Unable to tolerate higher doses of amlodipine due to bilateral lower extremity edema.  Blood pressure on recheck was good at 134/83.  Continue current medications.  Depending on lab results, we may need to dial back on chlorthalidone or switch to a different medication entirely. - CBC with Differential/Platelet - CMP14+EGFR - Lipid panel  Procedures performed this visit: None.  Return if symptoms worsen or fail to improve.  __________________________________ Thayer Ohm, DNP, APRN, FNP-BC Primary Care and Sports Medicine [facility] [address]

## 2022-05-26 NOTE — Telephone Encounter (Signed)
Prescription sent

## 2022-05-26 NOTE — Telephone Encounter (Signed)
Pt called. Pt is following up on pain meds that should've been sent over to [place], [address], [place].

## 2022-05-27 ENCOUNTER — Encounter: Payer: Self-pay | Admitting: Medical-Surgical

## 2022-05-27 ENCOUNTER — Other Ambulatory Visit: Payer: Self-pay | Admitting: Medical-Surgical

## 2022-05-27 DIAGNOSIS — E119 Type 2 diabetes mellitus without complications: Secondary | ICD-10-CM

## 2022-05-27 LAB — CBC WITH DIFFERENTIAL/PLATELET
Basophils Absolute: 0.1 10*3/uL (ref 0.0–0.2)
Basos: 1 %
EOS (ABSOLUTE): 0.2 10*3/uL (ref 0.0–0.4)
Eos: 4 %
Hematocrit: 41.3 % (ref 34.0–46.6)
Hemoglobin: 14.2 g/dL (ref 11.1–15.9)
Immature Grans (Abs): 0 10*3/uL (ref 0.0–0.1)
Immature Granulocytes: 0 %
Lymphocytes Absolute: 2 10*3/uL (ref 0.7–3.1)
Lymphs: 37 %
MCH: 31.5 pg (ref 26.6–33.0)
MCHC: 34.4 g/dL (ref 31.5–35.7)
MCV: 92 fL (ref 79–97)
Monocytes Absolute: 0.4 10*3/uL (ref 0.1–0.9)
Monocytes: 8 %
Neutrophils Absolute: 2.6 10*3/uL (ref 1.4–7.0)
Neutrophils: 50 %
Platelets: 227 10*3/uL (ref 150–450)
RBC: 4.51 x10E6/uL (ref 3.77–5.28)
RDW: 12.1 % (ref 11.7–15.4)
WBC: 5.3 10*3/uL (ref 3.4–10.8)

## 2022-05-27 LAB — HEMOGLOBIN A1C
Est. average glucose Bld gHb Est-mCnc: 146 mg/dL
Hgb A1c MFr Bld: 6.7 % — ABNORMAL HIGH (ref 4.8–5.6)

## 2022-05-27 LAB — CMP14+EGFR
ALT: 27 IU/L (ref 0–32)
AST: 25 IU/L (ref 0–40)
Albumin/Globulin Ratio: 1.8 (ref 1.2–2.2)
Albumin: 4.7 g/dL (ref 3.8–4.8)
Alkaline Phosphatase: 79 IU/L (ref 44–121)
BUN/Creatinine Ratio: 21 (ref 12–28)
BUN: 17 mg/dL (ref 8–27)
Bilirubin Total: 1 mg/dL (ref 0.0–1.2)
CO2: 27 mmol/L (ref 20–29)
Calcium: 10.6 mg/dL — ABNORMAL HIGH (ref 8.7–10.3)
Chloride: 100 mmol/L (ref 96–106)
Creatinine, Ser: 0.81 mg/dL (ref 0.57–1.00)
Globulin, Total: 2.6 g/dL (ref 1.5–4.5)
Glucose: 126 mg/dL — ABNORMAL HIGH (ref 70–99)
Potassium: 4.2 mmol/L (ref 3.5–5.2)
Sodium: 141 mmol/L (ref 134–144)
Total Protein: 7.3 g/dL (ref 6.0–8.5)
eGFR: 76 mL/min/{1.73_m2} (ref >59)

## 2022-05-27 LAB — LIPID PANEL
Chol/HDL Ratio: 3.1 ratio (ref 0.0–4.4)
Cholesterol, Total: 174 mg/dL (ref 100–199)
HDL: 57 mg/dL (ref >39)
LDL Chol Calc (NIH): 87 mg/dL (ref 0–99)
Triglycerides: 180 mg/dL — ABNORMAL HIGH (ref 0–149)
VLDL Cholesterol Cal: 30 mg/dL (ref 5–40)

## 2022-05-27 LAB — TROPONIN T: Troponin T (Highly Sensitive): 6 ng/L (ref 0–14)

## 2022-05-27 MED ORDER — BLOOD GLUCOSE TEST VI STRP: 1.0000 | ORAL_STRIP | Freq: Two times a day (BID) | 11 refills | Status: DC | PRN

## 2022-05-27 MED ORDER — LANCET DEVICE MISC: 1.0000 | Freq: Two times a day (BID) | 0 refills | Status: AC | PRN

## 2022-05-27 MED ORDER — LANCETS MISC. MISC: 1.0000 | Freq: Two times a day (BID) | 11 refills | Status: AC | PRN

## 2022-05-27 MED ORDER — BLOOD GLUCOSE MONITORING SUPPL DEVI: 1.0000 | Freq: Two times a day (BID) | 0 refills | Status: DC | PRN

## 2022-05-27 NOTE — Addendum Note (Signed)
Addended byChristen Butter on: 05/27/2022 07:59 AM   Modules accepted: Orders

## 2022-05-28 ENCOUNTER — Encounter: Payer: Medicare Other | Attending: Medical-Surgical | Admitting: Skilled Nursing Facility1

## 2022-05-28 DIAGNOSIS — E119 Type 2 diabetes mellitus without complications: Secondary | ICD-10-CM | POA: Insufficient documentation

## 2022-05-28 MED ORDER — LANCETS 30G MISC: 99 refills | Status: AC

## 2022-05-28 MED ORDER — FREESTYLE LITE TEST VI STRP: ORAL_STRIP | 12 refills | Status: AC

## 2022-05-28 MED ORDER — FREESTYLE LITE DEVI: 99 refills | Status: AC

## 2022-05-29 ENCOUNTER — Encounter: Payer: Self-pay | Admitting: Skilled Nursing Facility1

## 2022-05-29 NOTE — Addendum Note (Signed)
Addended by: Chalmers Cater on: 05/29/2022 03:06 PM   Modules accepted: Orders

## 2022-05-29 NOTE — Progress Notes (Signed)
Diabetes Self-Management Education  Visit Type: First/Initial   Patient was seen on 05/28/2022 for the first of a series of three diabetes self-management courses at the [facility].  Patient Education Plan per assessed needs and concerns is to attend three course education program for Diabetes Self Management Education.  A1C was 6.7   The following learning objectives were met by the patient during this class: Describe diabetes, types of diabetes and pathophysiology State some common risk factors for diabetes Defines the role of glucose and insulin Describe the relationship between diabetes and cardiovascular and other risks State the members of the Healthcare Team States the rationale for glucose monitoring and when to test State their individual Target Range State the importance of logging glucose readings and how to interpret the readings Identifies A1C target Explain the correlation between A1c and eAG values State symptoms and treatment of high blood glucose and low blood glucose Explain proper technique for glucose testing and identify proper sharps disposal  Handouts given during class include: How to Thrive:  A Guide for Your Journey with Diabetes by the ADA Meal Plan Card and carbohydrate content list Dietary intake form Low Sodium Flavoring Tips Types of Fats Dining Out Label reading Snack list The diabetes portion plate Diabetes Resources A1c to eAG Conversion Chart Blood Glucose Log Diabetes Recommended Care Schedule Support Group Diabetes Success Plan Core Class Satisfaction Survey   Follow-Up Plan: Attend core 2    05/29/2022  [name], identified by name and date of birth, is a 72 y.o. female with a diagnosis of Diabetes: Type 2.   ASSESSMENT  There were no vitals taken for this visit. There is no height or weight on file to calculate BMI.   Diabetes Self-Management Education - 05/29/22 0949       Visit  Information   Visit Type First/Initial      Initial Visit   Diabetes Type Type 2    Are you currently following a meal plan? No    Are you taking your medications as prescribed? Yes      Health Coping   How would you rate your overall health? Good      Psychosocial Assessment   Patient Belief/Attitude about Diabetes Motivated to manage diabetes    What is the hardest part about your diabetes right now, causing you the most concern, or is the most worrisome to you about your diabetes?   Making healty food and beverage choices    Self-care barriers None    Self-management support Family    Patient Concerns Nutrition/Meal planning    Special Needs None    Preferred Learning Style Auditory;Visual    Learning Readiness Change in progress    How often do you need to have someone help you when you read instructions, pamphlets, or other written materials from your doctor or pharmacy? 1 - Never      Pre-Education Assessment   Patient understands the diabetes disease and treatment process. Needs Instruction    Patient understands incorporating nutritional management into lifestyle. Needs Instruction    Patient undertands incorporating physical activity into lifestyle. Needs Instruction    Patient understands using medications safely. Needs Instruction    Patient understands monitoring blood glucose, interpreting and using results Needs Instruction    Patient understands prevention, detection, and treatment of acute complications. Needs Instruction    Patient understands prevention, detection, and treatment of chronic complications. Needs Instruction    Patient understands how to develop strategies to  address psychosocial issues. Needs Instruction    Patient understands how to develop strategies to promote health/change behavior. Needs Instruction      Complications   Last HgB A1C per patient/outside source 6.7 %    How often do you check your blood sugar? 0 times/day (not testing)    Have  you had a dilated eye exam in the past 12 months? Yes    Have you had a dental exam in the past 12 months? Yes    Are you checking your feet? No      Activity / Exercise   Activity / Exercise Type Light (walking / raking leaves)    How many days per week do you exercise? 3    How many minutes per day do you exercise? 30    Total minutes per week of exercise 90      Patient Education   Previous Diabetes Education No    Disease Pathophysiology Definition of diabetes, type 1 and 2, and the diagnosis of diabetes;Factors that contribute to the development of diabetes;Explored patient's options for treatment of their diabetes    Monitoring Taught/evaluated SMBG meter.;Purpose and frequency of SMBG.;Daily foot exams;Yearly dilated eye exam;Interpreting lab values - A1C, lipid, urine microalbumina.    Acute complications Taught prevention, symptoms, and  treatment of hypoglycemia - the 15 rule.;Discussed and identified patients' prevention, symptoms, and treatment of hyperglycemia.;Trained/discussed glucagon administration to patient and designated other.    Chronic complications Dental care;Retinopathy and reason for yearly dilated eye exams;Nephropathy, what it is, prevention of, the use of ACE, ARB's and early detection of through urine microalbumia.;Reviewed with patient heart disease, higher risk of, and prevention;Lipid levels, blood glucose control and heart disease;Assessed and discussed foot care and prevention of foot problems;Relationship between chronic complications and blood glucose control      Individualized Goals (developed by patient)   Medications Not Applicable    Monitoring  Test my blood glucose as discussed;Test blood glucose pre and post meals as discussed      Post-Education Assessment   Patient understands the diabetes disease and treatment process. Demonstrates understanding / competency    Patient understands incorporating nutritional management into lifestyle. Needs Review     Patient undertands incorporating physical activity into lifestyle. Needs Review    Patient understands using medications safely. Demonstrates understanding / competency    Patient understands monitoring blood glucose, interpreting and using results Demonstrates understanding / competency    Patient understands prevention, detection, and treatment of acute complications. Demonstrates understanding / competency    Patient understands prevention, detection, and treatment of chronic complications. Demonstrates understanding / competency    Patient understands how to develop strategies to address psychosocial issues. Needs Review    Patient understands how to develop strategies to promote health/change behavior. Needs Review      Outcomes   Expected Outcomes Demonstrated interest in learning. Expect positive outcomes    Future DMSE 2 wks    Program Status Not Completed             Individualized Plan for Diabetes Self-Management Training:   Learning Objective:  Patient will have a greater understanding of diabetes self-management. Patient education plan is to attend individual and/or group sessions per assessed needs and concerns.   Expected Outcomes:  Demonstrated interest in learning. Expect positive outcomes  Education material provided: ADA - How to Thrive: A Guide for Your Journey with Diabetes, Food label handouts, A1C conversion sheet, Meal plan card, My Plate, and Snack sheet  If  problems or questions, patient to contact team via:  Phone and Email  Future DSME appointment: 2 wks

## 2022-06-04 ENCOUNTER — Encounter: Payer: Medicare Other | Admitting: Skilled Nursing Facility1

## 2022-06-04 DIAGNOSIS — E119 Type 2 diabetes mellitus without complications: Secondary | ICD-10-CM

## 2022-06-05 ENCOUNTER — Encounter: Payer: Self-pay | Admitting: Skilled Nursing Facility1

## 2022-06-05 NOTE — Progress Notes (Signed)

## 2022-06-10 ENCOUNTER — Ambulatory Visit (INDEPENDENT_AMBULATORY_CARE_PROVIDER_SITE_OTHER): Payer: Medicare Other

## 2022-06-10 DIAGNOSIS — R55 Syncope and collapse: Secondary | ICD-10-CM

## 2022-06-11 ENCOUNTER — Encounter: Payer: Medicare Other | Attending: Medical-Surgical | Admitting: Dietician

## 2022-06-11 ENCOUNTER — Encounter: Payer: Self-pay | Admitting: Dietician

## 2022-06-11 DIAGNOSIS — E119 Type 2 diabetes mellitus without complications: Secondary | ICD-10-CM | POA: Diagnosis present

## 2022-06-11 NOTE — Progress Notes (Signed)
Patient was seen on 06/11/22 for the third of a series of three diabetes self-management courses at the [facility].   State the amount of activity recommended for healthy living Describe activities suitable for individual needs Identify ways to regularly incorporate activity into daily life Identify barriers to activity and ways to over come these barriers Identify diabetes medications being personally used and their primary action for lowering glucose and possible side effects Describe role of stress on blood glucose and develop strategies to address psychosocial issues Identify diabetes complications and ways to prevent them Explain how to manage diabetes during illness Evaluate success in meeting personal goal Establish 2-3 goals that they will plan to diligently work on  Goals:   I will be active 30 minutes or more 5 times a week  I will eat less unhealthy fats by eating less fat I will test my glucose at least 1 times a day, 5 days a week  Your patient has identified these potential barriers to change:  Motivation Finances Stress Lack of Family Support  Your patient has identified their diabetes self-care support plan as  Family Education officer, environmental Resources [facility] Support Group  American Diabetes Association Website    Plan:  Attend Support Group as desired

## 2022-06-18 ENCOUNTER — Ambulatory Visit (INDEPENDENT_AMBULATORY_CARE_PROVIDER_SITE_OTHER): Payer: Medicare Other | Admitting: Nurse Practitioner

## 2022-06-18 ENCOUNTER — Encounter: Payer: Self-pay | Admitting: Nurse Practitioner

## 2022-06-18 ENCOUNTER — Other Ambulatory Visit: Payer: Self-pay

## 2022-06-18 VITALS — BP 129/72 | HR 62 | Temp 98.2°F | Ht 62.0 in | Wt 170.0 lb

## 2022-06-18 DIAGNOSIS — E119 Type 2 diabetes mellitus without complications: Secondary | ICD-10-CM | POA: Diagnosis not present

## 2022-06-18 DIAGNOSIS — Z6831 Body mass index (BMI) 31.0-31.9, adult: Secondary | ICD-10-CM | POA: Diagnosis not present

## 2022-06-18 DIAGNOSIS — E669 Obesity, unspecified: Secondary | ICD-10-CM

## 2022-06-18 NOTE — Progress Notes (Signed)
Office: [phone]  /  Fax: [phone]   Initial Visit  Jodi Cobb was seen in clinic today to evaluate for obesity. She is interested in losing weight to improve overall health and reduce the risk of weight related complications. She presents today to review program treatment options, initial physical assessment, and evaluation.     She was referred by: PCP  When asked what else they would like to accomplish? She states: Improve existing medical conditions, Improve quality of life, and Lose a target amount of weight : 130 lbs  Weight history:  She started gaining weight in 2007 after being diagnosed with cushing's.  Her weight has fluctuated over the years.  When asked how has your weight affected you? She states: Contributed to medical problems  Some associated conditions: HTN, PVD, cushing's syndrome, thyroid nodule, essential tremor, hip pain, HLD  Contributing factors: none  Weight promoting medications identified: None  Current nutrition plan: Portion control / smart choices  Current level of physical activity: [place] 3-5 miles 4 days per week  Current or previous pharmacotherapy: None  Response to medication: Never tried medications   Past medical history includes:   Past Medical History:  Diagnosis Date   Allergy 1973   Anxiety 01/02/2020   Arthritis 05/05/2010   Blood transfusion without reported diagnosis 05/04/2005   Cancer of skin of upper arm    Cataract 2022   Colon polyps    GERD (gastroesophageal reflux disease) 05/05/2010   High cholesterol    Skin cancer of nose      Objective:   BP 129/72   Pulse 62   Temp 98.2 F (36.8 C)   Ht 5\' 2"  (1.575 m)   Wt 170 lb (77.1 kg)   SpO2 99%   BMI 31.09 kg/m  She was weighed on the bioimpedance scale: Body mass index is 31.09 kg/m.  Peak Weight:187 lbs , Body Fat%:42.4, Visceral Fat Rating:13, Weight trend over the last 12 months: Increasing  General:  Alert, oriented and cooperative. Patient  is in no acute distress.  Respiratory: Normal respiratory effort, no problems with respiration noted   Gait: able to ambulate independently  Mental Status: Normal mood and affect. Normal behavior. Normal judgment and thought content.   DIAGNOSTIC DATA REVIEWED:  BMET    Component Value Date/Time   NA 141 05/26/2022 1306   K 4.2 05/26/2022 1306   CL 100 05/26/2022 1306   CO2 27 05/26/2022 1306   GLUCOSE 126 (H) 05/26/2022 1306   GLUCOSE 134 (H) 11/06/2021 0000   BUN 17 05/26/2022 1306   CREATININE 0.81 05/26/2022 1306   CREATININE 0.73 11/06/2021 0000   CALCIUM 10.6 (H) 05/26/2022 1306   GFRNONAA 88 04/05/2020 0000   GFRAA 102 04/05/2020 0000   Lab Results  Component Value Date   HGBA1C 6.7 (H) 05/26/2022   No results found for: "INSULIN" CBC    Component Value Date/Time   WBC 5.3 05/26/2022 1306   WBC 4.2 11/06/2021 0000   RBC 4.51 05/26/2022 1306   RBC 4.48 11/06/2021 0000   HGB 14.2 05/26/2022 1306   HCT 41.3 05/26/2022 1306   PLT 227 05/26/2022 1306   MCV 92 05/26/2022 1306   MCH 31.5 05/26/2022 1306   MCH 31.7 11/06/2021 0000   MCHC 34.4 05/26/2022 1306   MCHC 34.9 11/06/2021 0000   RDW 12.1 05/26/2022 1306   Iron/TIBC/Ferritin/ %Sat No results found for: "IRON", "TIBC", "FERRITIN", "IRONPCTSAT" Lipid Panel     Component Value Date/Time  CHOL 174 05/26/2022 1306   TRIG 180 (H) 05/26/2022 1306   HDL 57 05/26/2022 1306   CHOLHDL 3.1 05/26/2022 1306   CHOLHDL 2.9 07/04/2021 0000   LDLCALC 87 05/26/2022 1306   LDLCALC 93 07/04/2021 0000   Hepatic Function Panel     Component Value Date/Time   PROT 7.3 05/26/2022 1306   ALBUMIN 4.7 05/26/2022 1306   AST 25 05/26/2022 1306   ALT 27 05/26/2022 1306   ALKPHOS 79 05/26/2022 1306   BILITOT 1.0 05/26/2022 1306      Component Value Date/Time   TSH 3.64 11/06/2021 0000     Assessment and Plan:   Type 2 diabetes mellitus without complication, without long-term current use of insulin (HCC) Continue  to follow up with PCP  Generalized obesity  BMI 31.0-31.9,adult   Patient would like to come in for one nutrition visit.  Will obtain IC at next visit. Will decide after that visit if she would like to continue with the program.      Obesity Treatment / Action Plan:  Patient will work on garnering support from family and friends to begin weight loss journey. Will work on eliminating or reducing the presence of highly palatable, calorie dense foods in the home. Will complete provided nutritional and psychosocial assessment questionnaire before the next appointment. Will be scheduled for indirect calorimetry to determine resting energy expenditure in a fasting state.  This will allow [place] to create a reduced calorie, high-protein meal plan to promote loss of fat mass while preserving muscle mass. Counseled on the health benefits of losing 5%-15% of total body weight. Was counseled on nutritional approaches to weight loss and benefits of reducing processed foods and consuming plant-based foods and high quality protein as part of nutritional weight management. Was counseled on pharmacotherapy and role as an adjunct in weight management.   Obesity Education Performed Today:  She was weighed on the bioimpedance scale and results were discussed and documented in the synopsis.  We discussed obesity as a disease and the importance of a more detailed evaluation of all the factors contributing to the disease.  We discussed the importance of long term lifestyle changes which include nutrition, exercise and behavioral modifications as well as the importance of customizing this to her specific health and social needs.  We discussed the benefits of reaching a healthier weight to alleviate the symptoms of existing conditions and reduce the risks of the biomechanical, metabolic and psychological effects of obesity.  Jodi Cobb appears to be in the action stage of change and states they are ready to  start intensive lifestyle modifications and behavioral modifications.  30 minutes was spent today on this visit including the above counseling, pre-visit chart review, and post-visit documentation.  Reviewed by clinician on day of visit: allergies, medications, problem list, medical history, surgical history, family history, social history, and previous encounter notes pertinent to obesity diagnosis.    Theodis Sato Zackry Deines FNP-C

## 2022-06-22 ENCOUNTER — Ambulatory Visit (INDEPENDENT_AMBULATORY_CARE_PROVIDER_SITE_OTHER): Payer: Medicare Other | Admitting: Medical-Surgical

## 2022-06-22 DIAGNOSIS — E119 Type 2 diabetes mellitus without complications: Secondary | ICD-10-CM

## 2022-06-22 LAB — POCT UA - MICROALBUMIN
Albumin/Creatinine Ratio, Urine, POC: <30
Creatinine, POC: 200 mg/dL
Microalbumin Ur, POC: 10 mg/L

## 2022-06-22 NOTE — Progress Notes (Signed)
Lab only 

## 2022-06-22 NOTE — Progress Notes (Signed)
Agree with documentation as below.  ___________________________________________ Vanna Shavers L. Jisell Majer, DNP, APRN, FNP-BC Primary Care and Sports Medicine [facility] [place]  

## 2022-07-15 ENCOUNTER — Ambulatory Visit: Payer: Medicare Other | Admitting: Nurse Practitioner

## 2022-08-14 IMAGING — US US FNA BIOPSY THYROID 1ST LESION
1 series · 13 of 15 positions shown · non-contrast
Comparison: Ultrasound thyroid dated 04/04/2020

MEDICATIONS:
3 mL 1% lidocaine

COMPLICATIONS:
None immediate.

INDICATION: Indeterminate right superior thyroid nodule. Request for fine-needle
aspiration.

EXAM:
ULTRASOUND GUIDED FINE NEEDLE ASPIRATION OF INDETERMINATE THYROID
NODULE
TECHNIQUE: Informed written consent was obtained from the patient after a
discussion of the risks, benefits and alternatives to treatment.
Questions regarding the procedure were encouraged and answered. A
timeout was performed prior to the initiation of the procedure.

[Series 1: us fna biopsy thyroid 1st lesion · 0.04mm/px · 15 acquisitions, 13 frames shown]
[im 1/15]
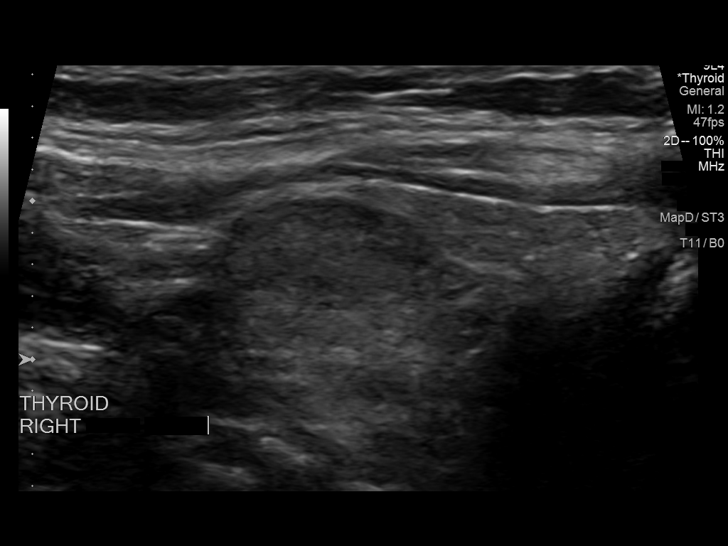
[im 2/15]
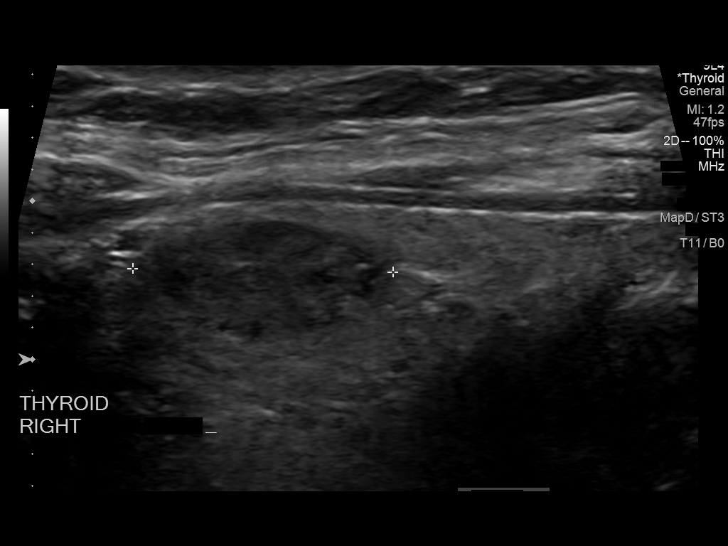
[im 3/15]
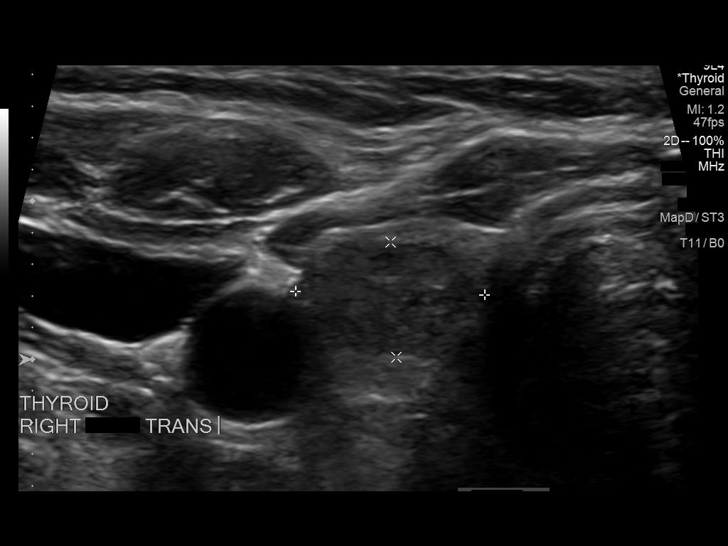
[im 5/15]
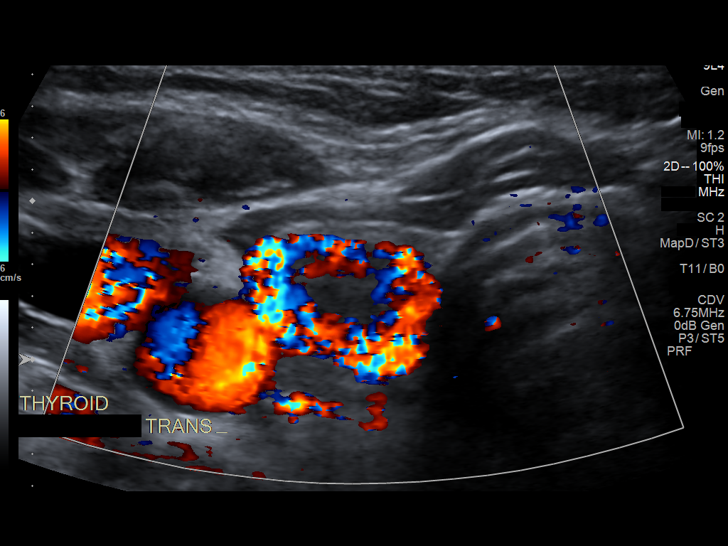
[im 6/15]
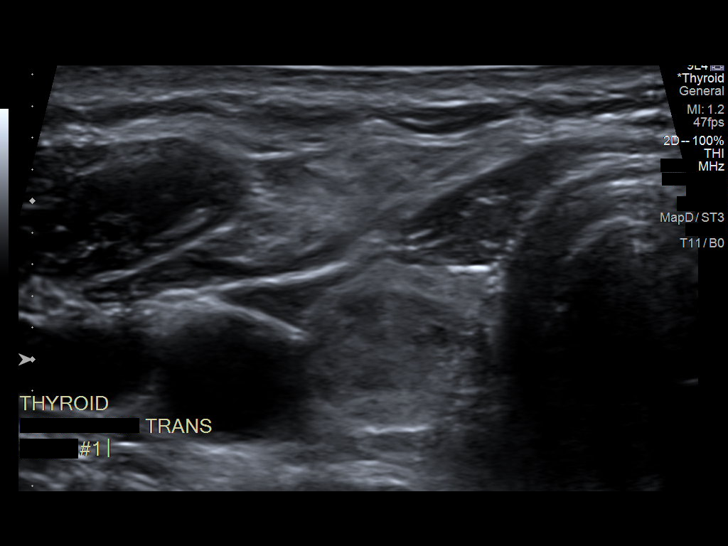
[im 7/15]
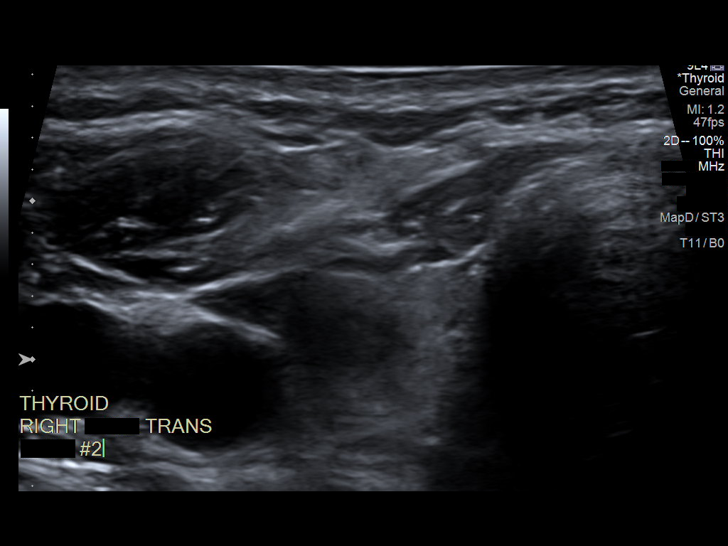
[im 8/15]
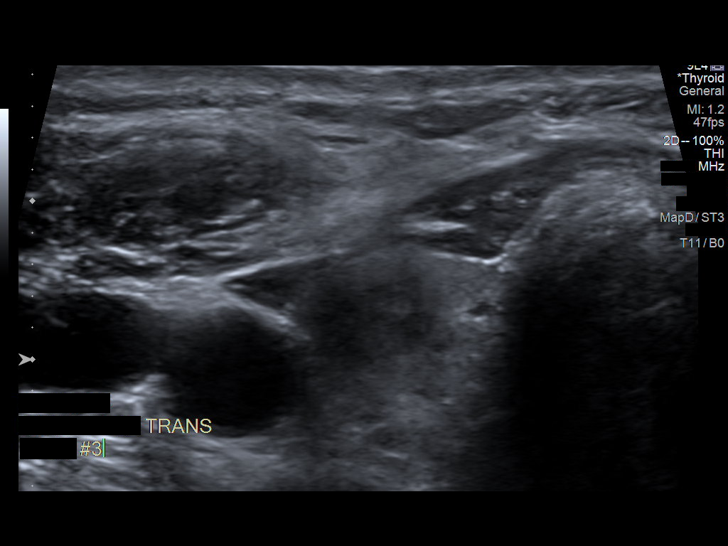
[im 9/15]
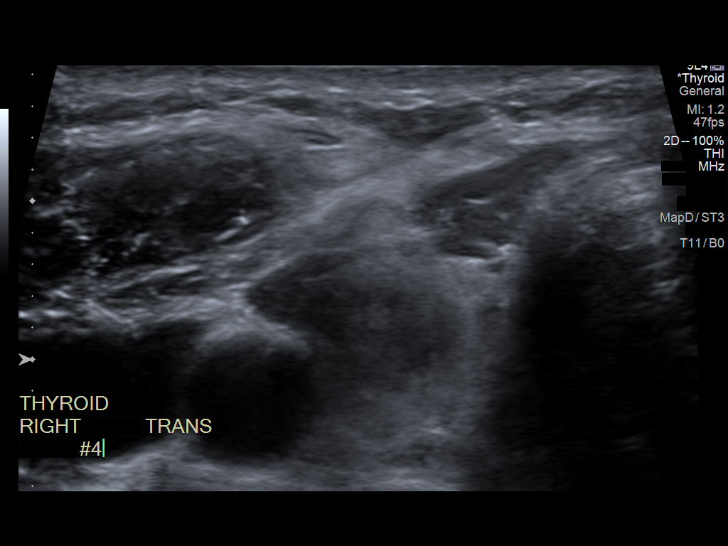
[im 10/15]
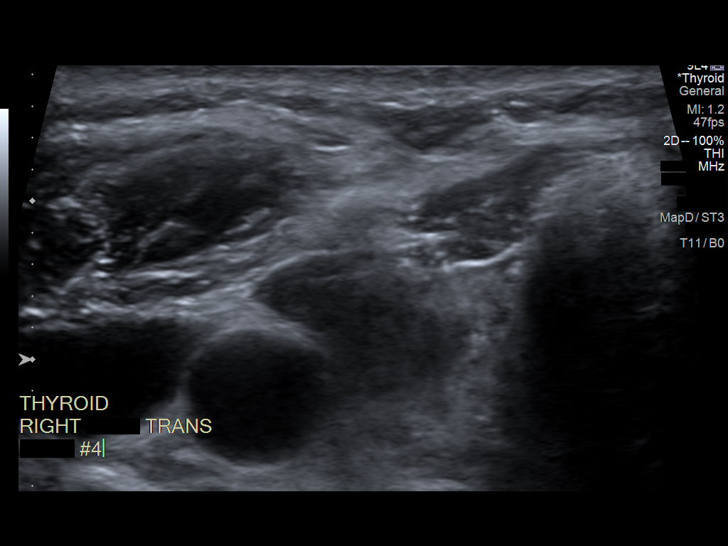
[im 11/15]
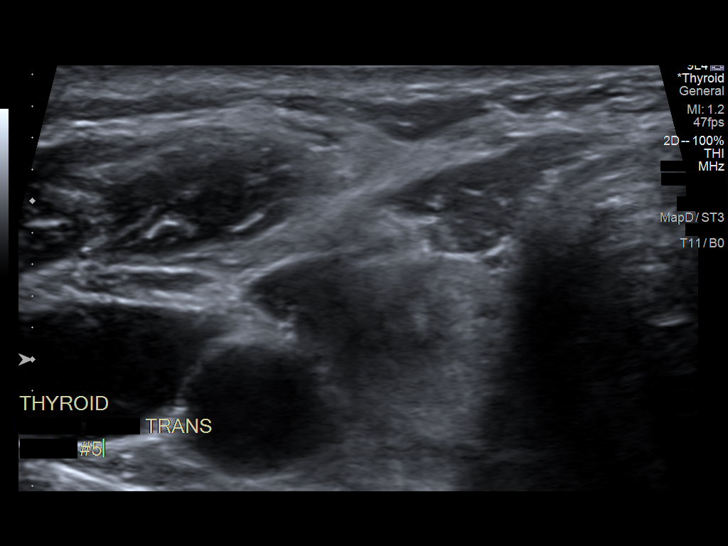
[im 13/15]
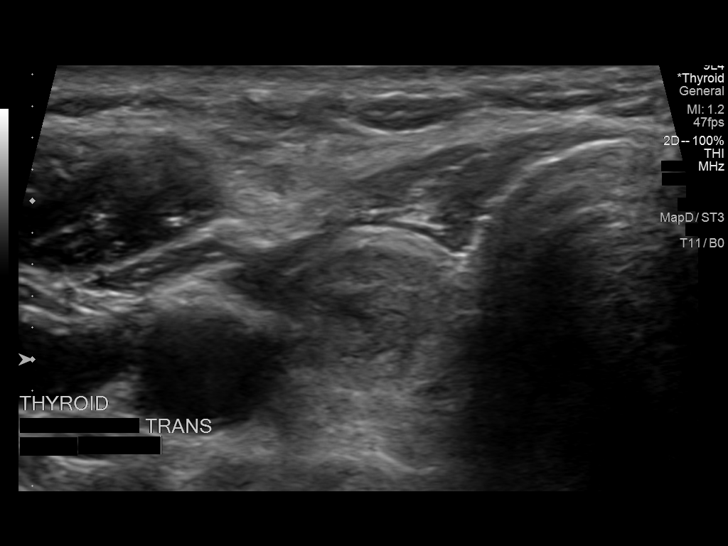
[im 14/15]
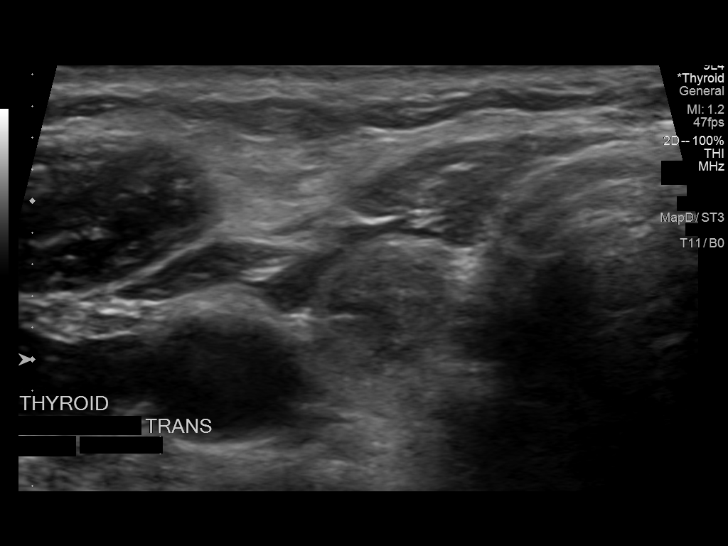
[im 15/15]
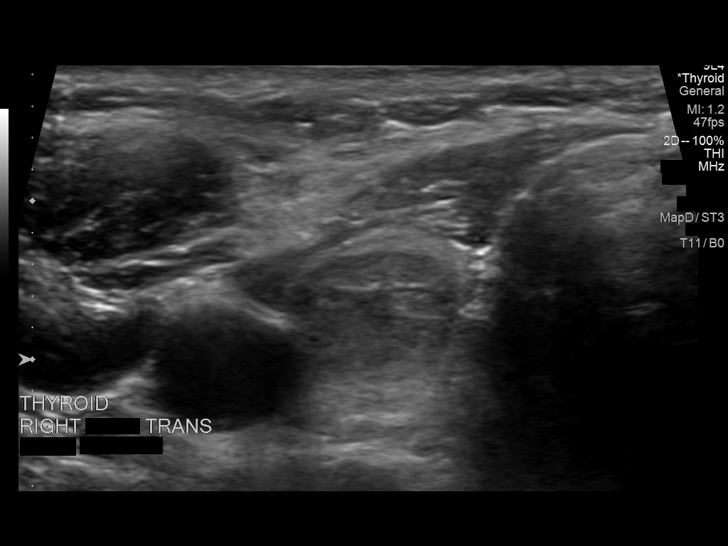

[13 of 15 positions shown; findings below may reference images not displayed]

Pre-procedural ultrasound scanning demonstrated unchanged size and
appearance of the indeterminate nodule within the right superior
thyroid gland.

The procedure was planned. The neck was prepped in the usual sterile
fashion, and a sterile drape was applied covering the operative
field. A timeout was performed prior to the initiation of the
procedure. Local anesthesia was provided with 1% lidocaine.

Under direct ultrasound guidance, 5 FNA biopsies were performed of
the indeterminate right superior thyroid nodule with a 25 gauge
needle, 2 of which were sent for Afirma testing. Multiple ultrasound
images were saved for procedural documentation purposes. The samples
were prepared and submitted to pathology.

Limited post procedural scanning was negative for hematoma or
additional complication. Dressings were placed. The patient
tolerated the above procedures procedure well without immediate
postprocedural complication.
FINDINGS: Nodule reference number based on prior diagnostic ultrasound: 1

Maximum size: 1.6 cm

Location: Right; Superior

ACR TI-RADS risk category: TR4 (4-6 points)

Reason for biopsy: meets ACR TI-RADS criteria

Ultrasound imaging confirms appropriate placement of the needles
within the thyroid nodule.
IMPRESSION: Technically successful ultrasound guided fine needle aspiration of
indeterminate right superior thyroid nodule.

Read by Akthar, Maxmillan

## 2022-08-19 ENCOUNTER — Ambulatory Visit: Payer: Medicare Other | Admitting: Medical-Surgical

## 2022-09-02 ENCOUNTER — Other Ambulatory Visit: Payer: Medicare Other

## 2022-09-02 ENCOUNTER — Other Ambulatory Visit: Payer: Self-pay

## 2022-09-02 DIAGNOSIS — E119 Type 2 diabetes mellitus without complications: Secondary | ICD-10-CM

## 2022-09-03 ENCOUNTER — Other Ambulatory Visit: Payer: Self-pay | Admitting: Medical-Surgical

## 2022-09-03 LAB — HEMOGLOBIN A1C
Est. average glucose Bld gHb Est-mCnc: 131 mg/dL
Hgb A1c MFr Bld: 6.2 % — ABNORMAL HIGH (ref 4.8–5.6)

## 2022-09-14 ENCOUNTER — Encounter: Payer: Medicare Other | Attending: Medical-Surgical | Admitting: Skilled Nursing Facility1

## 2022-09-14 ENCOUNTER — Encounter: Payer: Self-pay | Admitting: Skilled Nursing Facility1

## 2022-09-14 DIAGNOSIS — Z713 Dietary counseling and surveillance: Secondary | ICD-10-CM | POA: Diagnosis not present

## 2022-09-14 DIAGNOSIS — E119 Type 2 diabetes mellitus without complications: Secondary | ICD-10-CM | POA: Diagnosis present

## 2022-09-14 NOTE — Progress Notes (Signed)
Appt start time: 1:58 end time:  2:30  Patient was seen on 09/14/2022 for a review of the series of three diabetes self-management courses at the [facility]. The following learning objectives were met by the patient during this class:  Reviewed blood glucose monitoring and interpretation including the recommended target ranges and Hgb A1c.  Reviewed importance of regularly scheduled meals/snacks, and meal planning.  Reviewed the effects of physical activity on glucose levels and long-term glucose control.  Recommended goal of 150 minutes of physical activity/week. Reviewed patient medications and discussed role of medication on blood glucose and possible side effects. Discussed strategies to manage stress, psychosocial issues, and other obstacles to diabetes management. Encouraged moderate weight reduction to improve glucose levels.   Reviewed short-term complications: hyper- and hypo-glycemia.  Discussed causes, symptoms, and treatment options. Reviewed prevention, detection, and treatment of long-term complications.  Discussed the role of prolonged elevated glucose levels on body systems. Reviewed blood glucose monitoring  Goals:  Follow Diabetes Meal Plan as instructed  Eat 3 meals and 2 snacks, every 3-5 hrs  Limit carbohydrate intake to 45 grams carbohydrate/meal Limit carbohydrate intake to 15 grams carbohydrate/snack Add lean protein foods to meals/snacks  Monitor glucose levels as instructed by your doctor  Aim for goal of 15-30 mins of physical activity daily as tolerated  Bring food record and glucose log to your next nutrition visit

## 2022-09-23 IMAGING — CT CT ABD-PELV W/O CM
2 of 4 series · 16 of 46 positions shown, 18 images · non-contrast
Comparison: None.

CLINICAL DATA: Right-sided abdominal pain and constipation. History
of ventral incisional hernia repair.

EXAM:
CT ABDOMEN AND PELVIS WITHOUT CONTRAST
TECHNIQUE: Multidetector CT imaging of the abdomen and pelvis was performed
following the standard protocol without IV contrast.

[Series 2: axial st · axial · 0.73mm/px · z∈[-470,-50]mm · 13 of 92 slices shown, 15 images]
[im 4/92  soft-tissue]
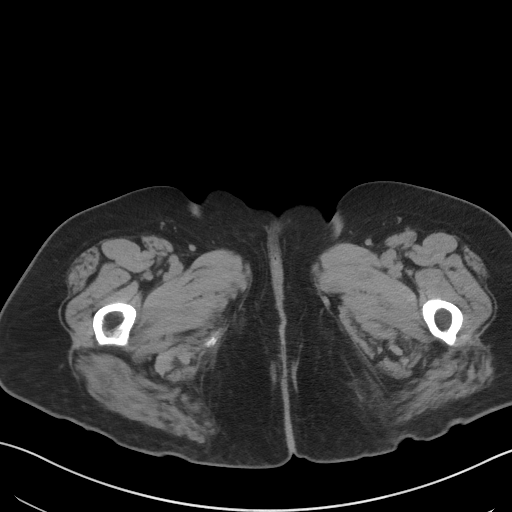
[im 4/92  bone]
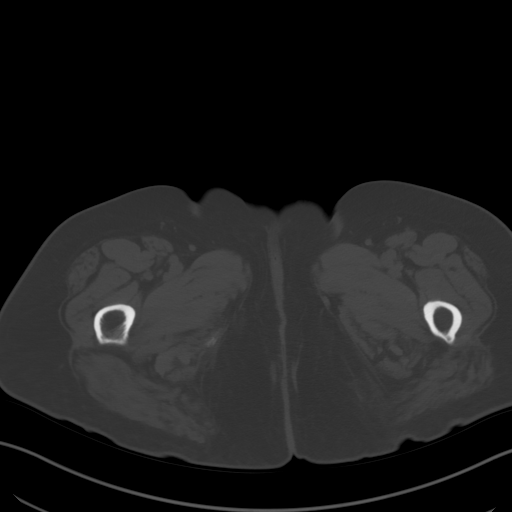
[im 12/92  soft-tissue]
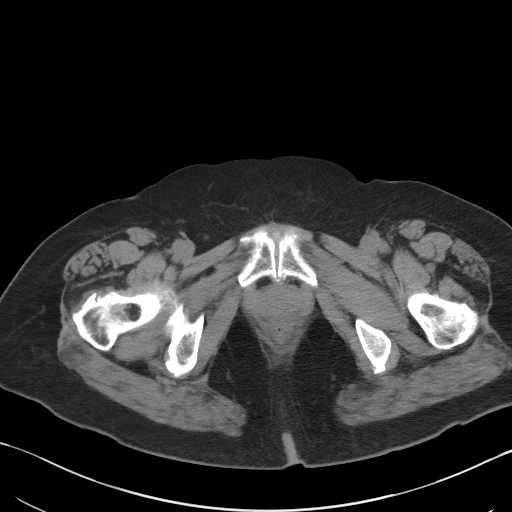
[im 19/92  soft-tissue]
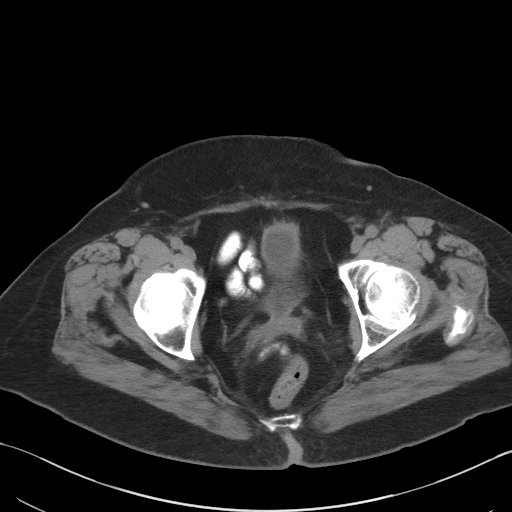
[im 27/92  soft-tissue]
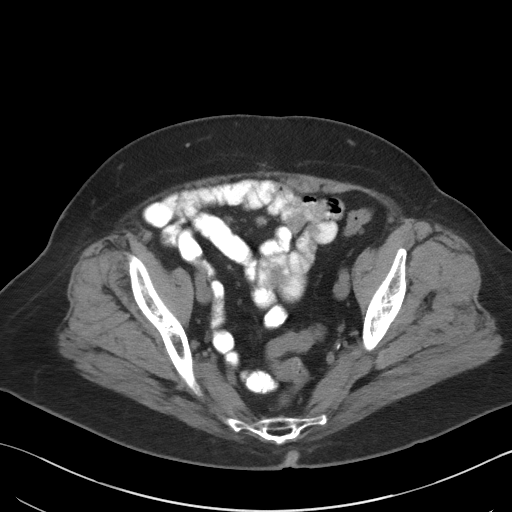
[im 31/92  soft-tissue]
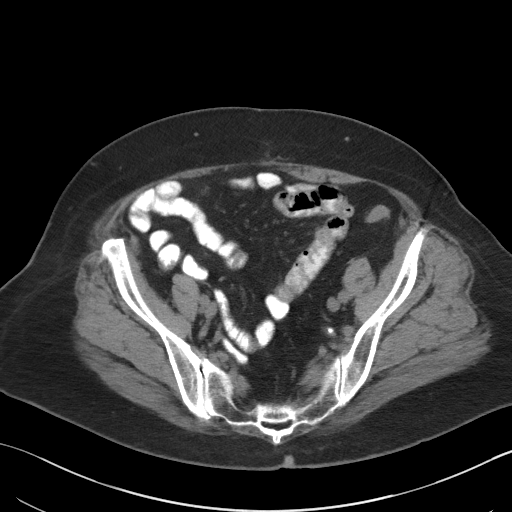
[im 38/92  soft-tissue]
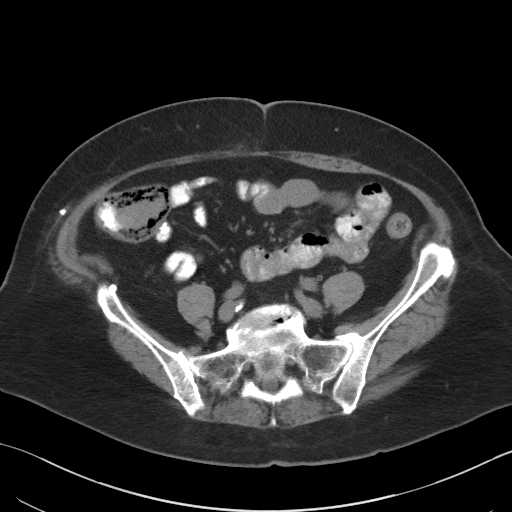
[im 46/92  soft-tissue]
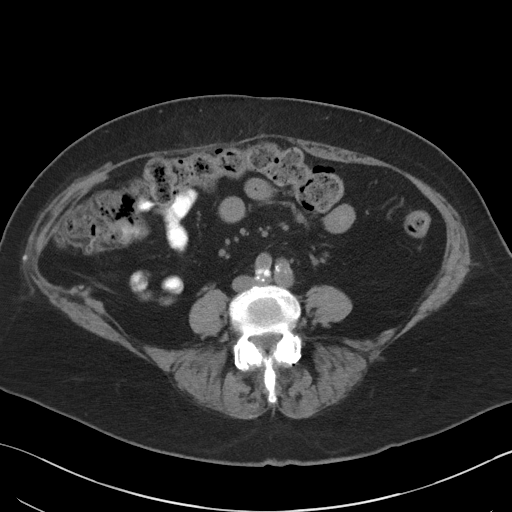
[im 54/92  soft-tissue]
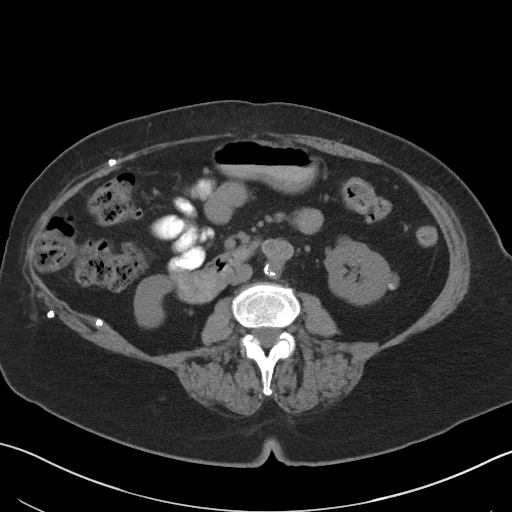
[im 61/92  soft-tissue]
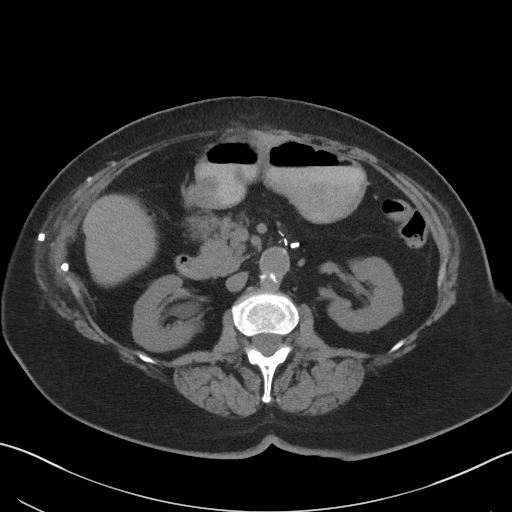
[im 61/92  bone]
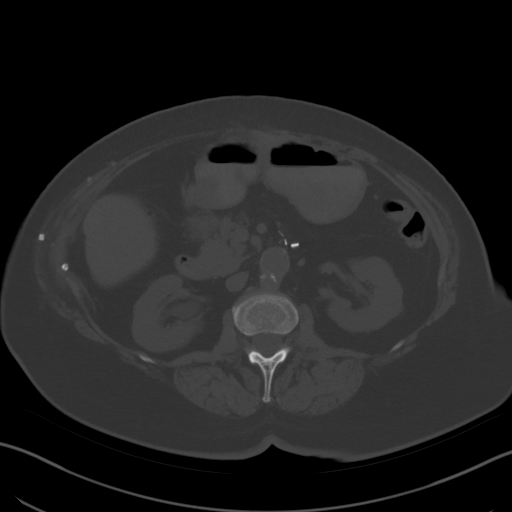
[im 65/92  soft-tissue]
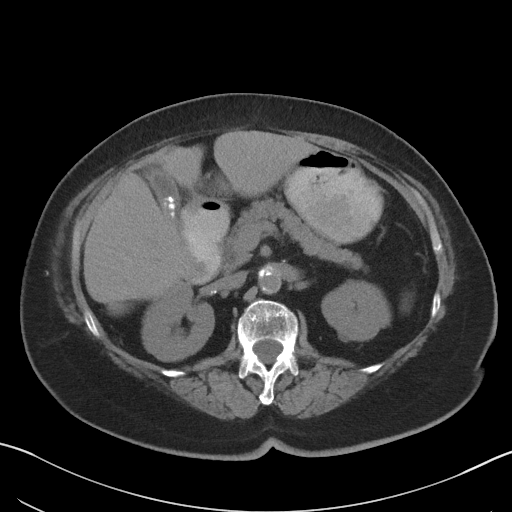
[im 73/92  soft-tissue]
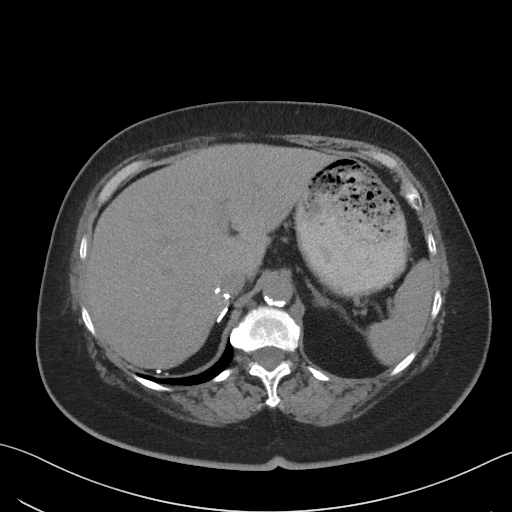
[im 80/92  soft-tissue]
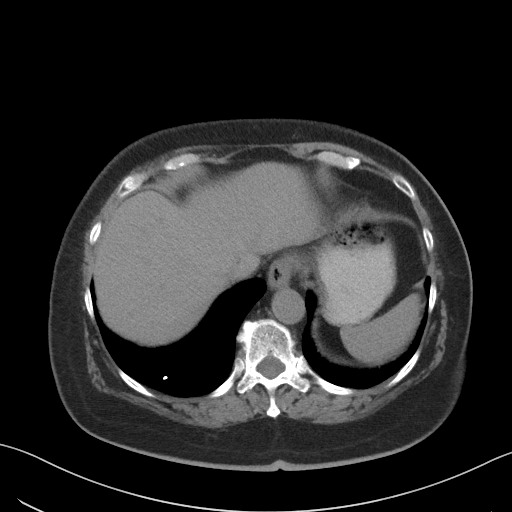
[im 88/92  soft-tissue]
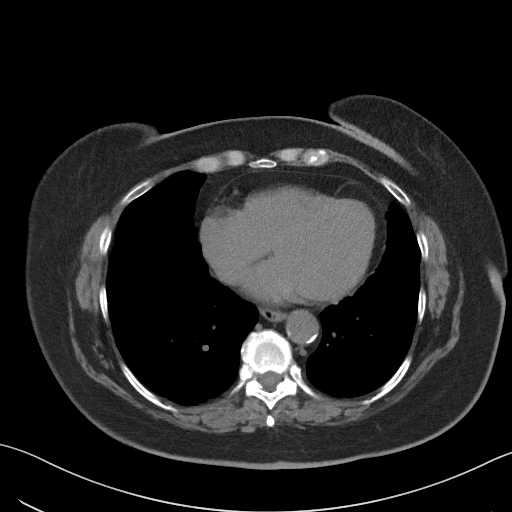

[Series 5: coronal st · coronal · 0.77mm/px · 3 of 94 slices shown]
[im 32/94  soft-tissue]
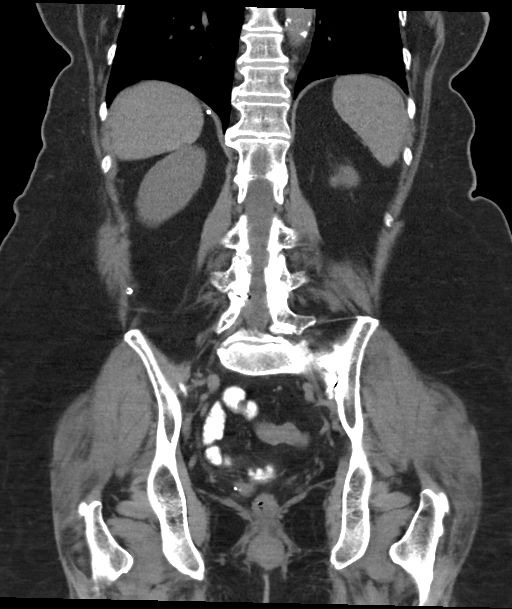
[im 42/94  soft-tissue]
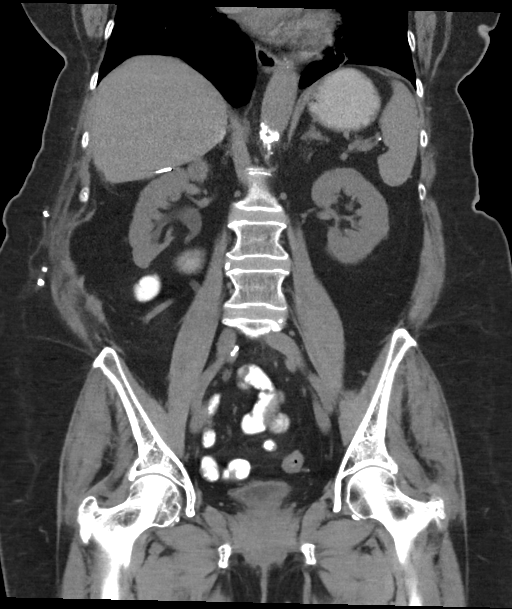
[im 52/94  soft-tissue]
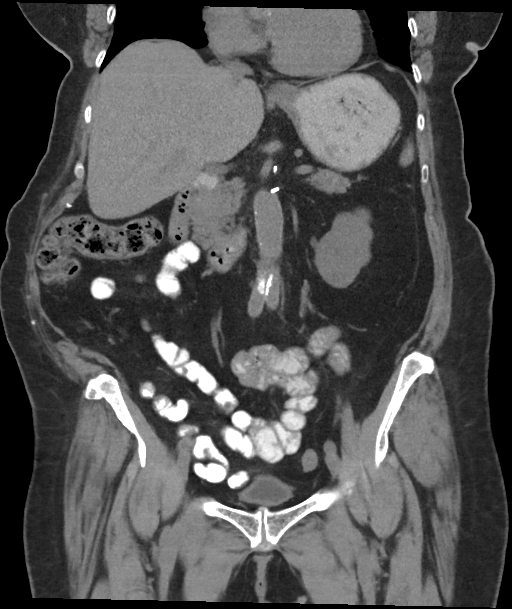

[16 of 46 positions shown; findings below may reference images not displayed]

FINDINGS: Lower chest: Scarring at the left lung base.

Hepatobiliary: The liver appears unremarkable. There are several
calcified gallstones in a relatively contracted gallbladder. No
biliary ductal dilatation identified.

Pancreas: Unremarkable. No pancreatic ductal dilatation or
surrounding inflammatory changes.

Spleen: Normal in size without focal abnormality.

Adrenals/Urinary Tract: Status post prior right adrenalectomy. The
left adrenal gland appears unremarkable. Both kidneys demonstrate no
evidence of hydronephrosis. Nonobstructing 4 mm calculus in the
lower pole of the left kidney. There are several small cortical
cysts bilaterally with some demonstrating peripheral calcification.
The bladder is nearly decompressed and unremarkable in appearance.

Stomach/Bowel: Bowel shows no evidence of obstruction, ileus,
inflammation or visible lesion. No evidence of free intraperitoneal
air.

Vascular/Lymphatic: Prior aorto bi-iliac bypass grafting. No
enlarged lymph nodes identified.

Reproductive: Status post hysterectomy. No adnexal masses.

Other: Extensive hernia mesh along the right abdominal wall. Hernia
mesh intact without evidence recurrent hernia or abnormal fluid
collection. There are several small defects of the anterior midline
abdominal wall demonstrating focal fat protrusion.

Musculoskeletal: No acute or significant osseous findings.
IMPRESSION: 1. No significant acute findings in the abdomen or pelvis.
2. Cholelithiasis.
3. Nonobstructing left lower pole renal calculus.
4. Prior aorto bi-iliac bypass grafting.
5. Right-sided abdominal wall hernia mesh appears intact without
evidence of recurrent hernia or complication. Several small anterior
midline abdominal wall hernia defects contain protruding fat.

## 2022-10-15 ENCOUNTER — Ambulatory Visit: Payer: Medicare Other | Admitting: Medical-Surgical

## 2022-10-15 ENCOUNTER — Encounter: Payer: Self-pay | Admitting: Medical-Surgical

## 2022-10-15 VITALS — BP 149/80 | HR 65 | Resp 20 | Ht 62.0 in | Wt 170.3 lb

## 2022-10-15 DIAGNOSIS — E119 Type 2 diabetes mellitus without complications: Secondary | ICD-10-CM | POA: Diagnosis not present

## 2022-10-15 DIAGNOSIS — Z23 Encounter for immunization: Secondary | ICD-10-CM

## 2022-10-15 DIAGNOSIS — F339 Major depressive disorder, recurrent, unspecified: Secondary | ICD-10-CM

## 2022-10-15 DIAGNOSIS — I10 Essential (primary) hypertension: Secondary | ICD-10-CM

## 2022-10-15 DIAGNOSIS — E782 Mixed hyperlipidemia: Secondary | ICD-10-CM | POA: Diagnosis not present

## 2022-10-15 HISTORY — DX: Major depressive disorder, recurrent, unspecified: F33.9

## 2022-10-15 NOTE — Progress Notes (Signed)
        Established patient visit  History, exam, impression, and plan:         Established patient visit  History, exam, impression, and plan:  1. Type 2 diabetes mellitus without complication, without long-term current use of insulin (HCC) Pleasant 72 year old female presenting today to follow-up on type 2 diabetes.  She has not been checking her sugars at home.  Has cut out sugars and is working on better diabetes control.  She does admit to drinking 1 Coke per week but otherwise drinks water or diet decaffeinated tea.  Recent hemoglobin A1c checked at the end of August showing 6.2% which indicates excellent control.  Not currently taking any medications but she has increased her exercise and is still working on weight loss.  Discussed the importance of A1c control.  Plan to recheck A1c in 6 months at her next follow-up appointment.  Continue dietary modifications.  Continue working on weight loss.  2. Benign essential HTN Currently taking amlodipine 5 mg daily with chlorthalidone 25 mg daily.  Tolerating both medications well without side effects.  Monitoring blood pressure at home with results in the 120s/80 range.  Occasionally has spikes where her blood pressure will go up but this usually resolves later in the afternoon when she has rested.  Denies concerning symptoms.  Cardiopulmonary exam normal.  Continue monitoring blood pressure at home with a goal of 130/80 or less.  Continue amlodipine and chlorthalidone as prescribed.  Recheck of blood pressure still slightly elevated so would like her to keep a close eye on this and let me know if it remains higher than goal.  3. Mixed hyperlipidemia Currently taking lovastatin 20 mg daily, tolerating well without side effects.  No concerning issues with joint pain, muscle tenderness, muscle weakness, or unusual fatigue.  Working on dietary modifications.  Continue lovastatin.  Up-to-date on lipid panel.  4. Depression, recurrent (HCC) Currently  not medicated for depression or anxiety.  Feels that she is doing well and is in a good headspace.  Denies SI/HI.  5. Need for shingles vaccine Shingrix No. 1 given in office today. - Zoster Recombinant (Shingrix )   Procedures performed this visit: None.  Return in about 5 months (around 03/15/2023) for DM/HTN/HLD follow up.  __________________________________ Thayer Ohm, DNP, APRN, FNP-BC Primary Care and Sports Medicine [facility] [place]

## 2022-12-07 ENCOUNTER — Ambulatory Visit: Payer: Medicare Other | Admitting: Medical-Surgical

## 2022-12-17 IMAGING — MG MM DIGITAL SCREENING BILAT W/ TOMO AND CAD
8 series · 8 of 24 positions shown · non-contrast
Comparison: None.

CLINICAL DATA: Screening.

EXAM:
DIGITAL SCREENING BILATERAL MAMMOGRAM WITH TOMOSYNTHESIS AND CAD
TECHNIQUE: Bilateral screening digital craniocaudal and mediolateral oblique
mammograms were obtained. Bilateral screening digital breast
tomosynthesis was performed. The images were evaluated with
computer-aided detection.

[L MLO synth-2D]
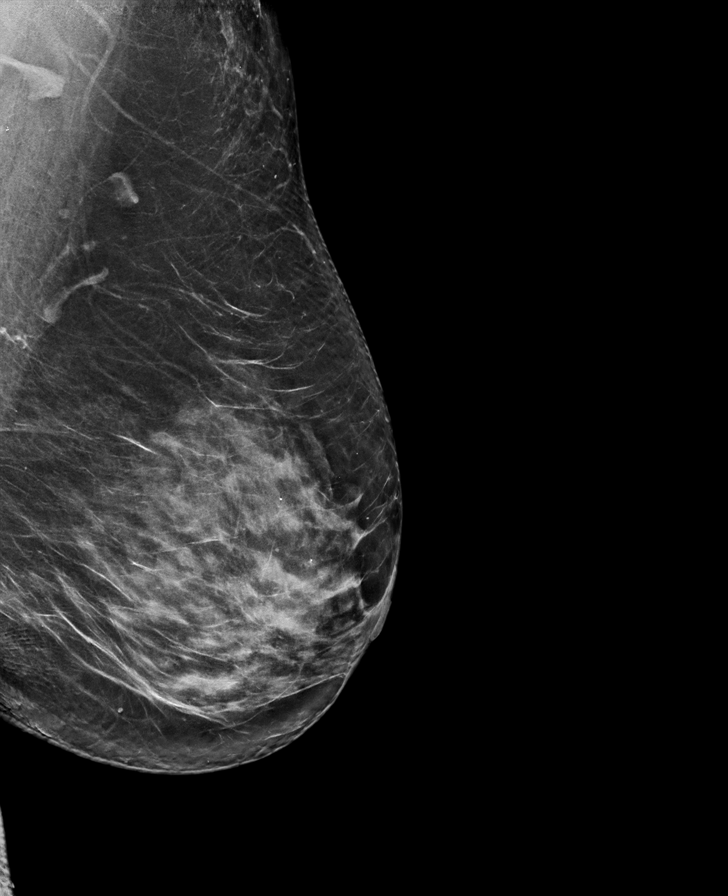

[R CC synth-2D]
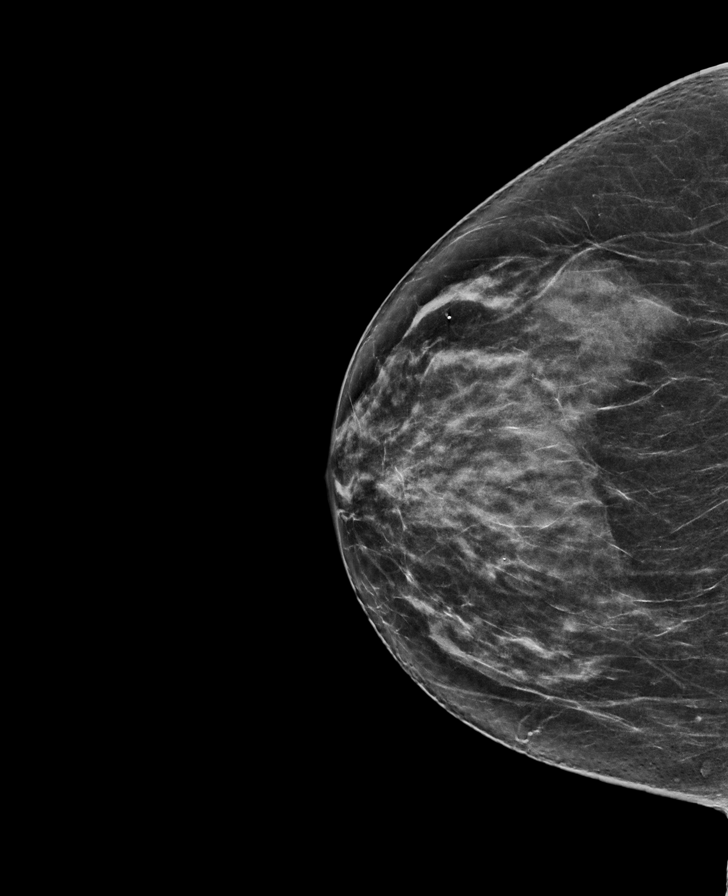

[R MLO synth-2D]
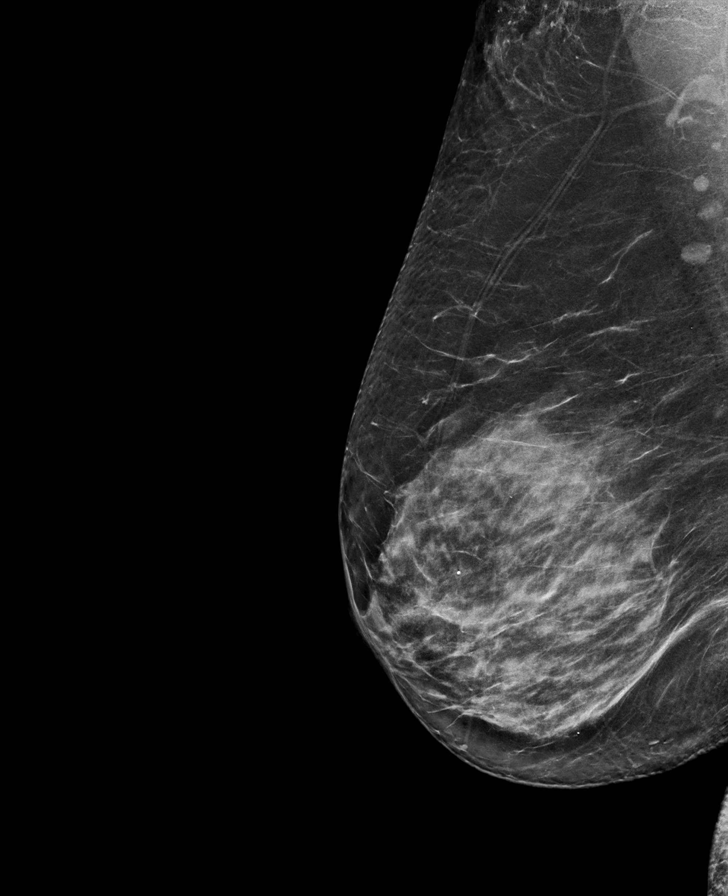

[L CC synth-2D]
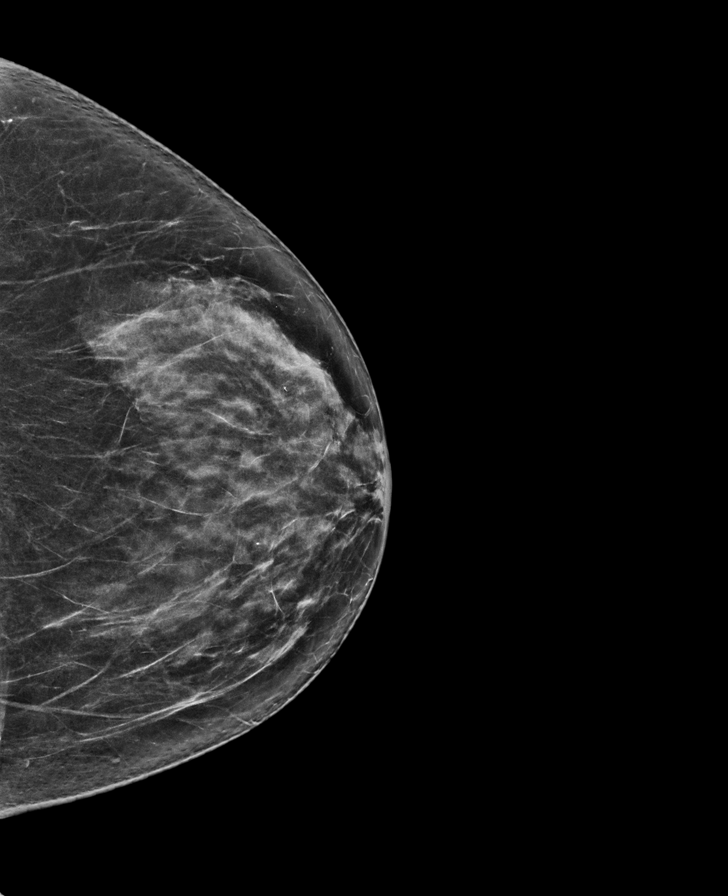

[L MLO tomo · tomo slice 41/81.0]
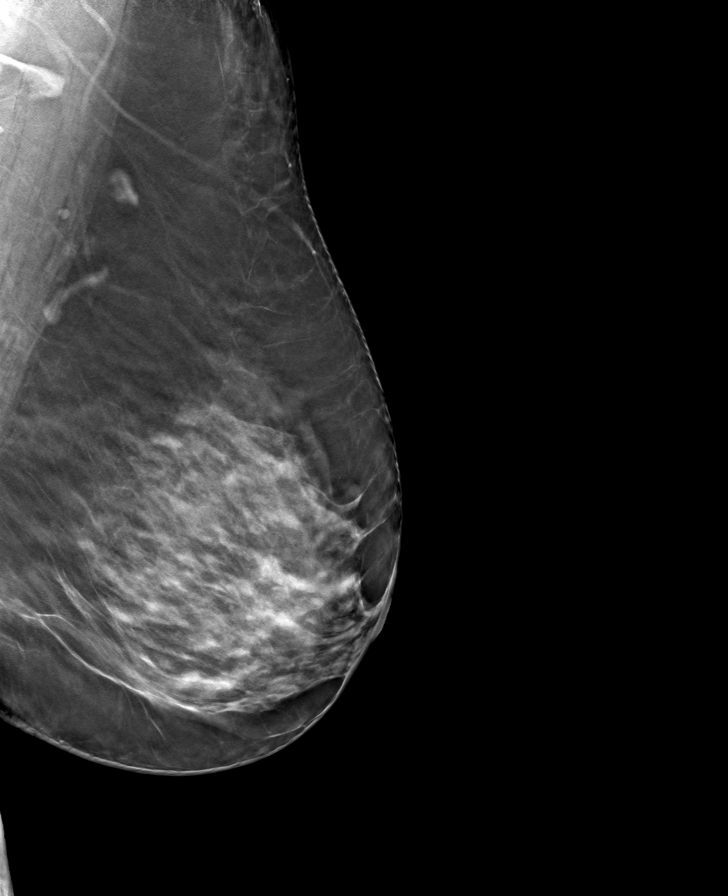

[R MLO tomo · tomo slice 39/76.0]
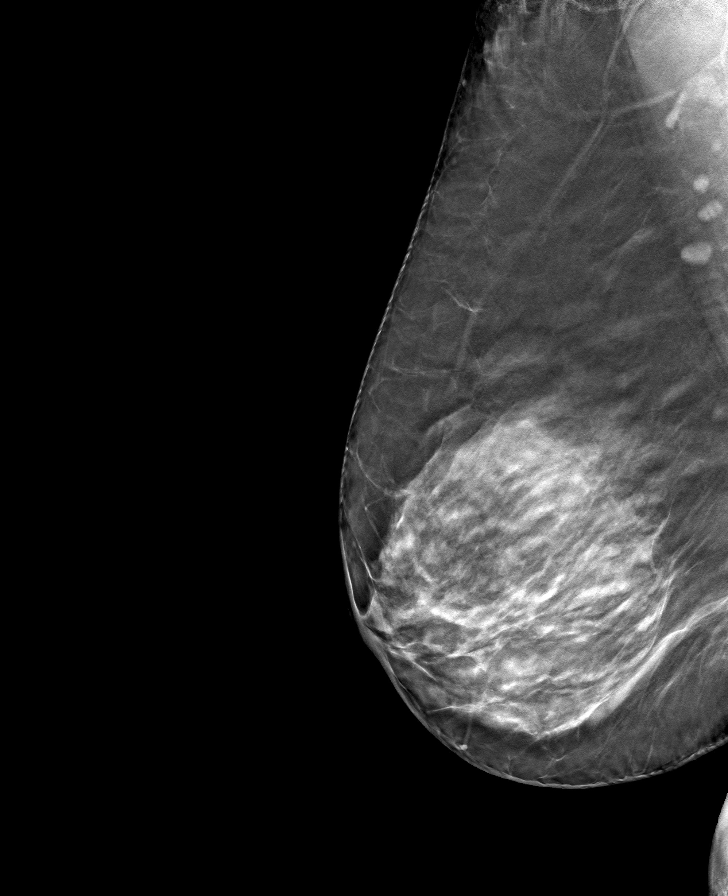

[L CC tomo · tomo slice 35/68.0]
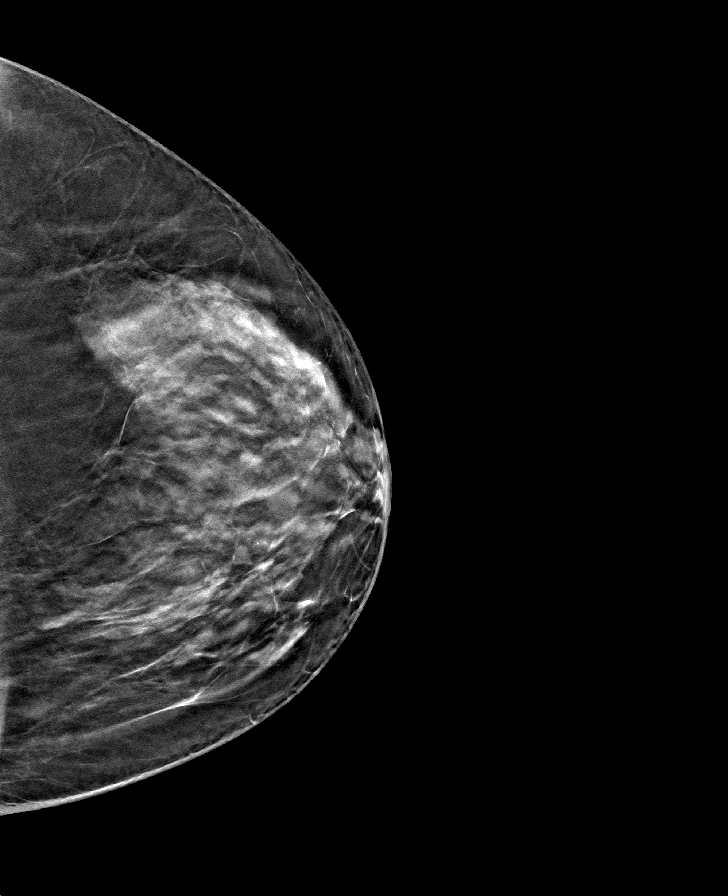

[R CC tomo · tomo slice 34/67.0]
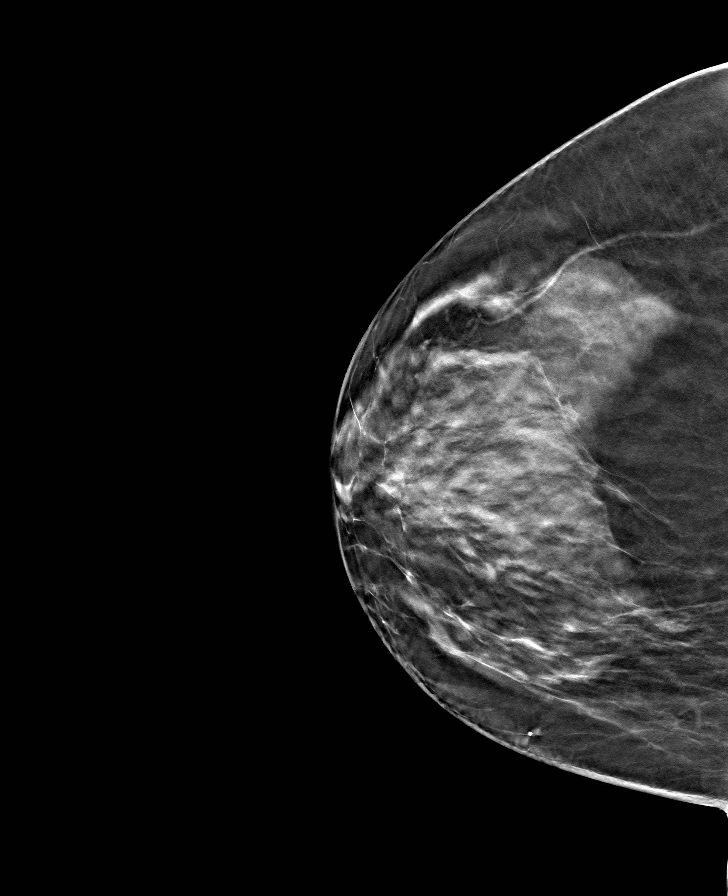

[8 of 24 positions shown; findings below may reference images not displayed]

ACR Breast Density Category c: The breast tissue is heterogeneously
dense, which may obscure small masses
FINDINGS: There are no findings suspicious for malignancy.
IMPRESSION: No mammographic evidence of malignancy. A result letter of this
screening mammogram will be mailed directly to the patient.

RECOMMENDATION:
Screening mammogram in one year. (Code:C8-T-HNK)

BI-RADS CATEGORY  1: Negative.

## 2023-03-15 ENCOUNTER — Ambulatory Visit (INDEPENDENT_AMBULATORY_CARE_PROVIDER_SITE_OTHER): Payer: Medicare Other | Admitting: Medical-Surgical

## 2023-03-15 ENCOUNTER — Encounter: Payer: Self-pay | Admitting: Medical-Surgical

## 2023-03-15 VITALS — BP 156/81 | HR 67 | Resp 20 | Ht 62.0 in | Wt 177.3 lb

## 2023-03-15 DIAGNOSIS — F339 Major depressive disorder, recurrent, unspecified: Secondary | ICD-10-CM

## 2023-03-15 DIAGNOSIS — I10 Essential (primary) hypertension: Secondary | ICD-10-CM

## 2023-03-15 DIAGNOSIS — E041 Nontoxic single thyroid nodule: Secondary | ICD-10-CM

## 2023-03-15 DIAGNOSIS — E119 Type 2 diabetes mellitus without complications: Secondary | ICD-10-CM | POA: Diagnosis not present

## 2023-03-15 DIAGNOSIS — E782 Mixed hyperlipidemia: Secondary | ICD-10-CM

## 2023-03-15 HISTORY — DX: Type 2 diabetes mellitus without complications: E11.9

## 2023-03-15 LAB — POCT GLYCOSYLATED HEMOGLOBIN (HGB A1C)
HbA1c, POC (controlled diabetic range): 6.2 % (ref 0.0–7.0)
Hemoglobin A1C: 6.2 % — AB (ref 4.0–5.6)

## 2023-03-15 MED ORDER — LOVASTATIN 20 MG PO TABS: 20.0000 mg | ORAL_TABLET | Freq: Every day | ORAL | 3 refills | Status: DC

## 2023-03-15 MED ORDER — AMLODIPINE BESYLATE 5 MG PO TABS
5.0000 mg | ORAL_TABLET | Freq: Every day | ORAL | 3 refills | Status: AC
Start: 2023-03-15 — End: ?

## 2023-03-15 MED ORDER — VALSARTAN 40 MG PO TABS: 40.0000 mg | ORAL_TABLET | Freq: Every day | ORAL | 3 refills | Status: AC

## 2023-03-15 MED ORDER — METFORMIN HCL ER 500 MG PO TB24: 500.0000 mg | ORAL_TABLET | Freq: Every day | ORAL | 1 refills | Status: AC

## 2023-03-15 MED ORDER — CHLORTHALIDONE 25 MG PO TABS
25.0000 mg | ORAL_TABLET | Freq: Every day | ORAL | 3 refills | Status: AC
Start: 2023-03-15 — End: ?

## 2023-03-15 NOTE — Progress Notes (Signed)
 Established patient visit  History, exam, impression, and plan:  1. Thyroid nodule (Primary) Very pleasant 73 year old female presenting today with a history of a thyroid nodule.  She has not had any recent concerns or symptoms that would indicate thyroid dysfunction.  No change in the size of the thyroid or difficulty swallowing.  Plan to check TSH today. - TSH  2. Depression, recurrent (HCC) She does have a history of depression but feels that she is doing well without medication at this time.  Denies SI/HI.  Of note, her mood is upbeat with a pleasant interactive affect.  Speech is normal.  Does not desire any medication or counseling interventions at this time.  3. Mixed hyperlipidemia She is currently taking lovastatin 20 mg daily, tolerating well without side effects.  Cognizant of following a low-fat heart healthy diet.  Not exercising as much as she was previously due to the cold weather but has plans to get back out and resume walking 5-7 miles per day.  Checking labs as below.  Continue lovastatin as prescribed. - lovastatin (MEVACOR) 20 MG tablet; Take 1 tablet (20 mg total) by mouth daily.  Dispense: 90 tablet; Refill: 3 - CMP14+EGFR - Lipid panel  4. Type 2 diabetes mellitus without complication, without long-term current use of insulin (HCC) She is currently diet and lifestyle controlled and has not been on any medications for diabetes management.  Not checking sugars at home as she has difficulty operating the glucometer.  Previous hemoglobin A1c 6.2% in August 2024.  Recheck of A1c today at 6.2% holding steady.  Diabetic foot exam completed with decreased sensation to the bilateral heels.  Discussed recommendations for foot exams and safe practices to prevent foot injuries.  Notes that she is very worried about her weight as she has gained 10 pounds in the last 6 months due to sedentary behaviors.  Interested in medication for diabetes and may help with weight loss.   Discussed options of starting a low-dose metformin (used off label for weight loss) versus starting an injectable such as Ozempic or Mounjaro.  Information on both injectable drugs provided with AVS.  She would like to proceed with starting the low-dose metformin so sending in metformin XR 500 mg once daily in the morning. - POCT HgB A1C - HM Diabetes Foot Exam  5. Benign essential HTN History of hypertension that is currently managed with amlodipine 5 mg and chlorthalidone 25 mg daily.  Compliant with medication dosing and tolerates both medications well without side effects.  She does check blood pressure at home however notes that her home readings are variable with some that are high and others that are normal.  She follows a low-sodium diet.  Has gained 10 pounds and been more sedentary over the past several months as noted above.  Plan to recheck labs today.  Recheck of blood pressure showing that she is still hypertensive.  Continue amlodipine and chlorthalidone as prescribed.  Adding valsartan 40 mg daily.  Continue to monitor blood pressures at home with a goal of 130/80 or less.  Return in 2 weeks for nurse visit for blood pressure check and bring home readings to this appointment.  Recommend working on weight loss and resuming typical physical activity. - amLODipine (NORVASC) 5 MG tablet; Take 1 tablet (5 mg total) by mouth daily.  Dispense: 90 tablet; Refill: 3 - chlorthalidone (HYGROTON) 25 MG tablet; Take 1 tablet (25 mg total) by mouth daily.  Dispense: 90 tablet;  Refill: 3 - CBC with Differential/Platelet - CMP14+EGFR - Lipid panel - valsartan (DIOVAN) 40 MG tablet; Take 1 tablet (40 mg total) by mouth daily.  Dispense: 90 tablet; Refill: 3   Procedures performed this visit: None.  Return in about 2 weeks (around 03/29/2023) for nurse visit for BP check.  __________________________________ Thayer Ohm, DNP, APRN, FNP-BC Primary Care and Sports Medicine [facility]  [place]

## 2023-03-15 NOTE — Patient Instructions (Signed)
 Semaglutide Injection What is this medication? SEMAGLUTIDE (SEM a GLOO tide) treats type 2 diabetes. It works by increasing insulin levels in your body, which decreases your blood sugar (glucose). It also reduces the amount of sugar released into your blood and slows down your digestion. It may also be used to lower the risk of heart attack and stroke in people with type 2 diabetes. Changes to diet and exercise are often combined with this medication. This medicine may be used for other purposes; ask your health care provider or pharmacist if you have questions. COMMON BRAND NAME(S): OZEMPIC What should I tell my care team before I take this medication? They need to know if you have any of these conditions: Eye disease caused by diabetes Gallbladder disease Have or have had pancreatitis Having surgery Kidney disease Personal or family history of MEN 2, a condition that causes endocrine gland tumors Personal or family history of thyroid cancer Stomach or intestine problems, such as problems digesting food An unusual or allergic reaction to semaglutide, other medications, foods, dyes, or preservatives Pregnant or trying to get pregnant Breastfeeding How should I use this medication? This medication is for injection under the skin of your upper leg (thigh), stomach area, or upper arm. It is given once every week (every 7 days). You will be taught how to prepare and give this medication. Use exactly as directed. Take your medication at regular intervals. Do not take it more often than directed. If you use this medication with insulin, you should inject this medication and the insulin separately. Do not mix them together. Do not give the injections right next to each other. Change (rotate) injection sites with each injection. It is important that you put your used needles and syringes in a special sharps container. Do not put them in a trash can. If you do not have a sharps container, call your  pharmacist or care team to get one. A special MedGuide will be given to you by the pharmacist with each prescription and refill. Be sure to read this information carefully each time. This medication comes with INSTRUCTIONS FOR USE. Ask your pharmacist for directions on how to use this medication. Read the information carefully. Talk to your pharmacist or care team if you have questions. Talk to your care team about the use of this medication in children. Special care may be needed. Overdosage: If you think you have taken too much of this medicine contact a poison control center or emergency room at once. NOTE: This medicine is only for you. Do not share this medicine with others. What if I miss a dose? If you miss a dose, take it as soon as you can within 5 days after the missed dose. Then take your next dose at your regular weekly time. If it has been longer than 5 days after the missed dose, do not take the missed dose. Take the next dose at your regular time. Do not take double or extra doses. If you have questions about a missed dose, contact your care team for advice. What may interact with this medication? Some medications may affect your blood sugar levels or hide the symptoms of low blood sugar (hypoglycemia). Talk with your care team about all the medications you take. They may suggest changes to your insulin dose or checking your blood sugar levels more often. Medications that may affect your blood sugar levels include: Alcohol Certain antibiotics, such as ciprofloxacin, levofloxacin, sulfamethoxazole; trimethoprim Certain medications for blood pressure or heart  disease, such as benazepril, enalapril, lisinopril, losartan, valsartan Certain medications for mental health conditions, such as fluoxetine or olanzapine Diuretics, such as hydrochlorothiazide (HCTZ) Estrogen and progestin hormones Other medications for diabetes Steroid medications, such as prednisone or  cortisone Testosterone Thyroid hormones Medications that may mask symptoms of low blood sugar include: Beta blockers, such as atenolol, metoprolol, propranolol Clonidine Guanethidine Reserpine This list may not describe all possible interactions. Give your health care provider a list of all the medicines, herbs, non-prescription drugs, or dietary supplements you use. Also tell them if you smoke, drink alcohol, or use illegal drugs. Some items may interact with your medicine. What should I watch for while using this medication? Visit your care team for regular checks on your progress. Tell your care team if your symptoms do not start to get better or if they get worse. You may need blood work done while you are taking this medication. Your care team will monitor your HbA1C (A1C). This test shows what your average blood sugar (glucose) level was over the past 2 to 3 months. Know the symptoms of low blood sugar and know how to treat it. Always carry a source of quick sugar with you. Examples include hard sugar candy or glucose tablets. Make sure others know that you can choke if you eat or drink if your blood sugar is too low and you are unable to care for yourself. Get medical help at once. Tell your care team if you have high blood sugar. Your medication dose may change if your body is under stress. Some types of stress that may affect your blood sugar include fever, infection, and surgery. Do not share pens or cartridges with anyone, even if the needle is changed. Each pen should only be used by one person. Sharing could cause an infection. Wear a medical ID bracelet or chain. Carry a card that describes your condition. List the medications and doses you take on the card. Talk to your care team about your risk of cancer. You may be more at risk for certain types of cancer if you take this medication. Talk to your care team right away if you have a lump or swelling in your neck, hoarseness that does  not go away, trouble swallowing, shortness of breath, or trouble breathing. Make sure you stay hydrated while taking this medication. Drink water often. Eat fruits and veggies that have a high water content. Drink more water when it is hot or you are active. Talk to your care team right away if you have fever, infection, vomiting, diarrhea, or if you sweat a lot while taking this medication. The loss of too much body fluid may make it dangerous for you to take this medication. If you are going to need surgery or a procedure, tell your care team that you are taking this medication. What side effects may I notice from receiving this medication? Side effects that you should report to your care team as soon as possible: Allergic reactions--skin rash, itching, hives, swelling of the face, lips, tongue, or throat Change in vision Dehydration--increased thirst, dry mouth, feeling faint or lightheaded, headache, dark yellow or brown urine Gallbladder problems--severe stomach pain, nausea, vomiting, fever Heart palpitations--rapid, pounding, or irregular heartbeat Kidney injury--decrease in the amount of urine, swelling of the ankles, hands, or feet Pancreatitis--severe stomach pain that spreads to your back or gets worse after eating or when touched, fever, nausea, vomiting Thoughts of suicide or self-harm, worsening mood, feelings of depression Thyroid cancer--new mass  or lump in the neck, pain or trouble swallowing, trouble breathing, hoarseness Side effects that usually do not require medical attention (report these to your care team if they continue or are bothersome): Diarrhea Loss of appetite Nausea Upset stomach This list may not describe all possible side effects. Call your doctor for medical advice about side effects. You may report side effects to FDA at 1-800-FDA-1088. Where should I keep my medication? Keep out of the reach of children. Store unopened pens in a refrigerator between 2 and 8  degrees C (36 and 46 degrees F). Do not freeze. Protect from light and heat. After you first use the pen, it can be stored for 56 days at room temperature between 15 and 30 degrees C (59 and 86 degrees F) or in a refrigerator. Throw away your used pen after 56 days or after the expiration date, whichever comes first. Do not store your pen with the needle attached. If the needle is left on, medication may leak from the pen. NOTE: This sheet is a summary. It may not cover all possible information. If you have questions about this medicine, talk to your doctor, pharmacist, or health care provider.  2024 [employer] Standard (2022-12-04 00:00:00)  Tirzepatide Injection What is this medication? TIRZEPATIDE (tir ZEP a tide) treats type 2 diabetes. It works by increasing insulin levels in your body, which decreases your blood sugar (glucose). It also reduces the amount of sugar released into your blood and slows down your digestion. Changes to diet and exercise are often combined with this medication. This medicine may be used for other purposes; ask your health care provider or pharmacist if you have questions. COMMON BRAND NAME(S): MOUNJARO What should I tell my care team before I take this medication? They need to know if you have any of these conditions: Eye disease caused by diabetes Gallbladder disease Have or have had pancreatitis Having surgery Kidney disease Personal or family history of MEN 2, a condition that causes endocrine gland tumors Personal or family history of thyroid cancer Stomach or intestine problems, such as problems digesting food An unusual or allergic reaction to tirzepatide, other medications, foods, dyes, or preservatives Pregnant or trying to get pregnant Breastfeeding How should I use this medication? This medication is injected under the skin. You will be taught how to prepare and give it. It is given once every week (every 7 days). Keep taking it unless your  health care provider tells you to stop. If you use this medication with insulin, you should inject this medication and the insulin separately. Do not mix them together. Do not give the injections right next to each other. Change (rotate) injection sites with each injection. This medication comes with INSTRUCTIONS FOR USE. Ask your pharmacist for directions on how to use this medication. Read the information carefully. Talk to your pharmacist or care team if you have questions. It is important that you put your used needles and syringes in a special sharps container. Do not put them in a trash can. If you do not have a sharps container, call your pharmacist or care team to get one. A special MedGuide will be given to you by the pharmacist with each prescription and refill. Be sure to read this information carefully each time. Talk to your care team about the use of this medication in children. Special care may be needed. Overdosage: If you think you have taken too much of this medicine contact a poison control center or emergency room at  once. NOTE: This medicine is only for you. Do not share this medicine with others. What if I miss a dose? If you miss a dose, take it as soon as you can unless it is more than 4 days (96 hours) late. If it is more than 4 days late, skip the missed dose. Take the next dose at the normal time. Do not take 2 doses within 3 days of each other. What may interact with this medication? Alcohol Antiviral medications for HIV or AIDS Aspirin and aspirin-like medications Beta blockers, such as atenolol, metoprolol, propranolol Certain medications for blood pressure, heart disease, irregular heart beat Chromium Clonidine Diuretics Estrogen or progestin hormones Fenofibrate Gemfibrozil Guanethidine Isoniazid Lanreotide Female hormones or anabolic steroids MAOIs, such as Marplan, Nardil, and Parnate Medications for weight loss Medications for allergies, asthma, cold, or  cough Medications for depression, anxiety, or mental health conditions Niacin Nicotine NSAIDs, medications for pain and inflammation, such as ibuprofen or naproxen Octreotide Other medications for diabetes, such as glyburide, glipizide, or glimepiride Pasireotide Pentamidine Phenytoin Probenecid Quinolone antibiotics, such as ciprofloxacin, levofloxacin, ofloxacin Reserpine Some herbal dietary supplements Steroid medications, such as prednisone or cortisone Sulfamethoxazole; trimethoprim Thyroid hormones Warfarin This list may not describe all possible interactions. Give your health care provider a list of all the medicines, herbs, non-prescription drugs, or dietary supplements you use. Also tell them if you smoke, drink alcohol, or use illegal drugs. Some items may interact with your medicine. What should I watch for while using this medication? Visit your care team for regular checks on your progress. Tell your care team if your symptoms do not start to get better or if they get worse. You may need blood work done while you are taking this medication. Your care team will monitor your HbA1C (A1C). This test shows what your average blood sugar (glucose) level was over the past 2 to 3 months. Know the symptoms of low blood sugar and know how to treat it. Always carry a source of quick sugar with you. Examples include hard sugar candy or glucose tablets. Make sure others know that you can choke if you eat or drink if your blood sugar is too low and you are unable to care for yourself. Get medical help at once. Tell your care team if you have high blood sugar. Your medication dose may change if your body is under stress. Some types of stress that may affect your blood sugar include fever, infection, and surgery. Do not share pens or cartridges with anyone, even if the needle is changed. Each pen should only be used by one person. Sharing could cause an infection. Wear a medical ID bracelet or  chain. Carry a card that describes your condition. List the medications and doses you take on the card. Talk to your care team about your risk of cancer. You may be more at risk for certain types of cancer if you take this medication. Talk to your care team right away if you have a lump or swelling in your neck, hoarseness that does not go away, trouble swallowing, shortness of breath, or trouble breathing. Make sure you stay hydrated while taking this medication. Drink water often. Eat fruits and veggies that have a high water content. Drink more water when it is hot or you are active. Talk to your care team right away if you have fever, infection, vomiting, diarrhea, or if you sweat a lot while taking this medication. The loss of too much body fluid may make  it dangerous for you to take this medication. If you are going to need surgery or a procedure, tell your care team that you are taking this medication. Estrogen and progestin hormones that you take by mouth may not work as well while you are taking this medication. Switch to a non-oral contraceptive or add a barrier contraceptive for 4 weeks after starting this medication and after each dose increase. Talk to your care team about contraceptive options. They can help you find the option that works for you. What side effects may I notice from receiving this medication? Side effects that you should report to your care team as soon as possible: Allergic reactions or angioedema--skin rash, itching or hives, swelling of the face, eyes, lips, tongue, arms, or legs, trouble swallowing or breathing Change in vision Dehydration--increased thirst, dry mouth, feeling faint or lightheaded, headache, dark yellow or brown urine Gallbladder problems--severe stomach pain, nausea, vomiting, fever Kidney injury--decrease in the amount of urine, swelling of the ankles, hands, or feet Pancreatitis--severe stomach pain that spreads to your back or gets worse after  eating or when touched, fever, nausea, vomiting Thoughts of suicide or self-harm, worsening mood, feelings of depression Thyroid cancer--new mass or lump in the neck, pain or trouble swallowing, trouble breathing, hoarseness Side effects that usually do not require medical attention (report these to your care team if they continue or are bothersome): Diarrhea Loss of appetite Nausea Upset stomach This list may not describe all possible side effects. Call your doctor for medical advice about side effects. You may report side effects to FDA at 1-800-FDA-1088. Where should I keep my medication? Keep out of the reach of children and pets. Refrigeration (preferred): Store in the refrigerator. Keep this medication in the original carton until you are ready to take it. Do not freeze. Protect from light. Get rid of opened vials after use, even if there is medication left. Get rid of any unopened vials or pens after the expiration date. Room Temperature: This medication may be stored at room temperature below 30 degrees C (86 degrees F) for up to 21 days. Keep this medication in the original carton until you are ready to take it. Protect from light. Avoid exposure to extreme heat. Get rid of opened vials after use, even if there is medication left. Get rid of any unopened vials or pens after 21 days, or after they expire, whichever is first. To get rid of medications that are no longer needed or have expired: Take the medication to a medication take-back program. Check with your pharmacy or law enforcement to find a location. If you cannot return the medication, ask your pharmacist or care team how to get rid of this medication safely. NOTE: This sheet is a summary. It may not cover all possible information. If you have questions about this medicine, talk to your doctor, pharmacist, or health care provider.  2024 [employer] Standard (2022-12-04 00:00:00)

## 2023-03-16 ENCOUNTER — Encounter: Payer: Self-pay | Admitting: Medical-Surgical

## 2023-03-16 DIAGNOSIS — E782 Mixed hyperlipidemia: Secondary | ICD-10-CM

## 2023-03-16 LAB — CBC WITH DIFFERENTIAL/PLATELET
Basophils Absolute: 0.1 10*3/uL (ref 0.0–0.2)
Basos: 1 %
EOS (ABSOLUTE): 0.2 10*3/uL (ref 0.0–0.4)
Eos: 3 %
Hematocrit: 43.2 % (ref 34.0–46.6)
Hemoglobin: 14.4 g/dL (ref 11.1–15.9)
Immature Grans (Abs): 0 10*3/uL (ref 0.0–0.1)
Immature Granulocytes: 0 %
Lymphocytes Absolute: 2.2 10*3/uL (ref 0.7–3.1)
Lymphs: 43 %
MCH: 30.8 pg (ref 26.6–33.0)
MCHC: 33.3 g/dL (ref 31.5–35.7)
MCV: 92 fL (ref 79–97)
Monocytes Absolute: 0.3 10*3/uL (ref 0.1–0.9)
Monocytes: 6 %
Neutrophils Absolute: 2.4 10*3/uL (ref 1.4–7.0)
Neutrophils: 47 %
Platelets: 226 10*3/uL (ref 150–450)
RBC: 4.68 x10E6/uL (ref 3.77–5.28)
RDW: 12.2 % (ref 11.7–15.4)
WBC: 5.1 10*3/uL (ref 3.4–10.8)

## 2023-03-16 LAB — CMP14+EGFR
ALT: 25 IU/L (ref 0–32)
AST: 28 IU/L (ref 0–40)
Albumin: 4.5 g/dL (ref 3.8–4.8)
Alkaline Phosphatase: 87 IU/L (ref 44–121)
BUN/Creatinine Ratio: 17 (ref 12–28)
BUN: 11 mg/dL (ref 8–27)
Bilirubin Total: 1.3 mg/dL — ABNORMAL HIGH (ref 0.0–1.2)
CO2: 22 mmol/L (ref 20–29)
Calcium: 10.1 mg/dL (ref 8.7–10.3)
Chloride: 99 mmol/L (ref 96–106)
Creatinine, Ser: 0.64 mg/dL (ref 0.57–1.00)
Globulin, Total: 3 g/dL (ref 1.5–4.5)
Glucose: 102 mg/dL — ABNORMAL HIGH (ref 70–99)
Potassium: 4 mmol/L (ref 3.5–5.2)
Sodium: 139 mmol/L (ref 134–144)
Total Protein: 7.5 g/dL (ref 6.0–8.5)
eGFR: 93 mL/min/{1.73_m2} (ref >59)

## 2023-03-16 LAB — LIPID PANEL
Chol/HDL Ratio: 3.1 ratio (ref 0.0–4.4)
Cholesterol, Total: 196 mg/dL (ref 100–199)
HDL: 63 mg/dL (ref >39)
LDL Chol Calc (NIH): 98 mg/dL (ref 0–99)
Triglycerides: 209 mg/dL — ABNORMAL HIGH (ref 0–149)
VLDL Cholesterol Cal: 35 mg/dL (ref 5–40)

## 2023-03-16 LAB — TSH: TSH: 2.32 u[IU]/mL (ref 0.450–4.500)

## 2023-03-17 MED ORDER — LOVASTATIN 40 MG PO TABS: 20.0000 mg | ORAL_TABLET | Freq: Every day | ORAL | 3 refills | Status: DC

## 2023-03-22 ENCOUNTER — Encounter: Payer: Self-pay | Admitting: Medical-Surgical

## 2023-03-22 DIAGNOSIS — E782 Mixed hyperlipidemia: Secondary | ICD-10-CM

## 2023-03-23 MED ORDER — LOVASTATIN 40 MG PO TABS
40.0000 mg | ORAL_TABLET | Freq: Every day | ORAL | 3 refills | Status: AC
Start: 2023-03-23 — End: ?

## 2023-03-29 ENCOUNTER — Ambulatory Visit

## 2023-04-05 ENCOUNTER — Ambulatory Visit (INDEPENDENT_AMBULATORY_CARE_PROVIDER_SITE_OTHER): Admitting: Medical-Surgical

## 2023-04-05 VITALS — BP 122/66 | HR 71 | Ht 62.0 in | Wt 177.3 lb

## 2023-04-05 DIAGNOSIS — R030 Elevated blood-pressure reading, without diagnosis of hypertension: Secondary | ICD-10-CM

## 2023-04-05 NOTE — Progress Notes (Signed)
 Pt here today for a BP recheck. Denies med changes, SOB, or CP.                         Pt BP taken 1st reading 138/83. Which is normal per Christen Butter, NP retake to see if it comes dwn any and RTC in 3 months. 2nd reading 122/66

## 2023-04-23 LAB — HM DIABETES EYE EXAM

## 2023-04-27 ENCOUNTER — Ambulatory Visit (INDEPENDENT_AMBULATORY_CARE_PROVIDER_SITE_OTHER): Payer: Medicare Other

## 2023-04-27 VITALS — Ht 62.0 in | Wt 173.0 lb

## 2023-04-27 DIAGNOSIS — Z1231 Encounter for screening mammogram for malignant neoplasm of breast: Secondary | ICD-10-CM

## 2023-04-27 DIAGNOSIS — Z0000 Encounter for general adult medical examination without abnormal findings: Secondary | ICD-10-CM

## 2023-04-27 NOTE — Progress Notes (Signed)
 Subjective:   Jodi Cobb is a 73 y.o. female who presents for Medicare Annual (Subsequent) preventive examination.  Visit Complete: Virtual I connected with  Jodi Cobb on 04/27/23 by a audio enabled telemedicine application and verified that I am speaking with the correct person using two identifiers.  Patient Location: Home  Provider Location: Office/Clinic  I discussed the limitations of evaluation and management by telemedicine. The patient expressed understanding and agreed to proceed.  Vital Signs: Because this visit was a virtual/telehealth visit, some criteria may be missing or patient reported. Any vitals not documented were not able to be obtained and vitals that have been documented are patient reported.  Patient Medicare AWV questionnaire was completed by the patient on 04/24/2023; I have confirmed that all information answered by patient is correct and no changes since this date.  Cardiac Risk Factors include: advanced age (>35men, >5 women);hypertension;dyslipidemia;obesity (BMI >30kg/m2);smoking/ tobacco exposure     Objective:    Today's Vitals   04/27/23 1450  Weight: 173 lb (78.5 kg)  Height: 5\' 2"  (1.575 m)   Body mass index is 31.64 kg/m.     04/27/2023    3:01 PM 04/22/2022    1:01 PM 07/10/2020    3:29 PM  Advanced Directives  Does Patient Have a Medical Advance Directive? Yes Yes No  Type of Estate agent of [place];Living will Living will Living will;Healthcare Power of Attorney  Does patient want to make changes to medical advance directive?  No - Patient declined No - Patient declined  Copy of Healthcare Power of Attorney in Chart? No - copy requested  No - copy requested    Current Medications (verified) Outpatient Encounter Medications as of 04/27/2023  Medication Sig   amLODipine  (NORVASC ) 5 MG tablet Take 1 tablet (5 mg total) by mouth daily.   aspirin EC 81 MG tablet Take 81 mg by mouth daily. Swallow whole.    Calcium Carbonate-Vit D-Min (CALCIUM 1200 PO) Take by mouth.   chlorthalidone  (HYGROTON ) 25 MG tablet Take 1 tablet (25 mg total) by mouth daily.   clotrimazole -betamethasone  (LOTRISONE ) cream Apply topically twice daily to the affected area for up to 14 days.   Cyanocobalamin (B-12) 2500 MCG TABS    docusate sodium (COLACE) 100 MG capsule Take 100 mg by mouth 2 (two) times daily.   latanoprost (XALATAN) 0.005 % ophthalmic solution 1 drop at bedtime.   lovastatin  (MEVACOR ) 40 MG tablet Take 1 tablet (40 mg total) by mouth daily.   metFORMIN  (GLUCOPHAGE -XR) 500 MG 24 hr tablet Take 1 tablet (500 mg total) by mouth daily with breakfast.   Multiple Vitamin (MULTIVITAMIN) tablet Take 1 tablet by mouth daily.   Omega-3 Fatty Acids (FISH OIL) 1000 MG CAPS Take by mouth.   Probiotic Product (PROBIOTIC-10 PO) Take by mouth.   valsartan  (DIOVAN ) 40 MG tablet Take 1 tablet (40 mg total) by mouth daily.   vitamin C (ASCORBIC ACID) 500 MG tablet Take 500 mg by mouth daily.   VITAMIN D, CHOLECALCIFEROL, PO Take by mouth.   Blood Glucose Monitoring Suppl (FREESTYLE LITE) DEVI Use to check fasting glucose in the morning and 2 hours after largest meal of the day. (Patient not taking: Reported on 04/27/2023)   glucose blood (FREESTYLE LITE) test strip Use to check fasting glucose in the morning and 2 hours after largest meal of the day. (Patient not taking: Reported on 04/27/2023)   Lancets 30G MISC Use to check fasting glucose in the morning and  2 hours after largest meal of the day. (Patient not taking: Reported on 04/27/2023)   No facility-administered encounter medications on file as of 04/27/2023.    Allergies (verified) Penicillins   History: Past Medical History:  Diagnosis Date   Allergy 1973   Anxiety 01/02/2020   Arthritis 05/05/2010   Blood transfusion without reported diagnosis 05/04/2005   Cancer of skin of upper arm    Cataract 2022   Colon polyps    Depression, recurrent (HCC)  10/15/2022   GERD (gastroesophageal reflux disease) 05/05/2010   High cholesterol    Skin cancer of nose    Type 2 diabetes mellitus without complication, without long-term current use of insulin (HCC) 03/15/2023   Past Surgical History:  Procedure Laterality Date   ABDOMINAL HYSTERECTOMY  1999   ADRENALECTOMY Right 2007   AORTIC/RENAL BYPASS  2009   Broken Bone Repair Left    ECTOPIC PREGNANCY SURGERY  1976   HERNIA REPAIR Right 2011   large ventral incisional hernia    SKIN CANCER EXCISION     VAGINAL HYSTERECTOMY     Family History  Problem Relation Age of Onset   Alzheimer's disease Mother    Anxiety disorder Mother    Cancer Mother    Pancreatic cancer Father    Cancer Father    COPD Father    Hearing loss Father    Lung cancer Sister    Alcohol abuse Sister    Cancer Sister    Early death Sister    Cancer Maternal Grandmother    Arthritis Sister    Arthritis Brother    Cancer Son    Cancer Paternal Uncle    Social History   Socioeconomic History   Marital status: Widowed    Spouse name: John   Number of children: 1   Years of education: 12   Highest education level: Associate degree: occupational, Scientist, product/process development, or vocational program  Occupational History   Occupation: Retired  Tobacco Use   Smoking status: Former    Current packs/day: 0.00    Average packs/day: 1 pack/day for 36.3 years (36.3 ttl pk-yrs)    Types: Cigarettes    Start date: 09/07/1968    Quit date: 01/10/2005    Years since quitting: 18.3   Smokeless tobacco: Never  Vaping Use   Vaping status: Never Used  Substance and Sexual Activity   Alcohol use: Yes    Comment: Occasionally a glass of wine   Drug use: Never   Sexual activity: Not Currently    Partners: Male    Birth control/protection: Abstinence  Other Topics Concern   Not on file  Social History Narrative   Lives alone. She has one child. She enjoys playing word games on her tablet.   Social Drivers of Research scientist (physical sciences) Strain: Low Risk  (04/27/2023)   Overall Financial Resource Strain (CARDIA)    Difficulty of Paying Living Expenses: Not hard at all  Food Insecurity: No Food Insecurity (04/27/2023)   Hunger Vital Sign    Worried About Running Out of Food in the Last Year: Never true    Ran Out of Food in the Last Year: Never true  Transportation Needs: No Transportation Needs (04/27/2023)   PRAPARE - Administrator, Civil Service (Medical): No    Lack of Transportation (Non-Medical): No  Physical Activity: Sufficiently Active (04/27/2023)   Exercise Vital Sign    Days of Exercise per Week: 7 days    Minutes of Exercise  per Session: 90 min  Stress: No Stress Concern Present (04/27/2023)   [employer] of Occupational Health - Occupational Stress Questionnaire    Feeling of Stress : Not at all  Social Connections: Moderately Integrated (04/27/2023)   Social Connection and Isolation Panel [NHANES]    Frequency of Communication with Friends and Family: More than three times a week    Frequency of Social Gatherings with Friends and Family: Three times a week    Attends Religious Services: More than 4 times per year    Active Member of Clubs or Organizations: Yes    Attends Banker Meetings: More than 4 times per year    Marital Status: Widowed  Recent Concern: Social Connections - Moderately Isolated (03/12/2023)   Social Connection and Isolation Panel [NHANES]    Frequency of Communication with Friends and Family: More than three times a week    Frequency of Social Gatherings with Friends and Family: Twice a week    Attends Religious Services: 1 to 4 times per year    Active Member of [employer] or Organizations: No    Attends Banker Meetings: Not on file    Marital Status: Widowed    Tobacco Counseling Counseling given: Not Answered   Clinical Intake:  Pre-visit preparation completed: Yes  Pain : No/denies pain     BMI - recorded:  31.64 Nutritional Status: BMI > 30  Obese Nutritional Risks: None Diabetes: Yes CBG done?: No Did pt. bring in CBG monitor from home?: No  How often do you need to have someone help you when you read instructions, pamphlets, or other written materials from your doctor or pharmacy?: 1 - Never What is the last grade level you completed in school?: 12  Interpreter Needed?: No      Activities of Daily Living    04/27/2023    2:52 PM 04/23/2023   12:55 PM  In your present state of health, do you have any difficulty performing the following activities:  Hearing? 0 0  Vision? 0 0  Difficulty concentrating or making decisions? 0 0  Walking or climbing stairs? 0 0  Dressing or bathing? 0 0  Doing errands, shopping? 0 0  Preparing Food and eating ? N N  Using the Toilet? N N  In the past six months, have you accidently leaked urine? Y Y  Do you have problems with loss of bowel control? N N  Managing your Medications? N N  Managing your Finances? N N  Housekeeping or managing your Housekeeping? N N    Patient Care Team: Cherre Cornish, NP as PCP - General (Nurse Practitioner) Adine Hoof, MD as Consulting Physician (Vascular Surgery)  Indicate any recent Medical Services you may have received from other than [facility] providers in the past year (date may be approximate).     Assessment:   This is a routine wellness examination for Jodi Cobb.  Hearing/Vision screen No results found.   Goals Addressed             This Visit's Progress    Patient Stated       Patient stated she would like to lose weight.       Depression Screen    04/27/2023    2:59 PM 04/22/2022    1:01 PM 11/06/2021    8:18 AM 10/09/2021    1:28 PM 10/09/2021    1:19 PM 10/07/2020    3:41 PM 07/10/2020    3:30 PM  PHQ 2/9 Scores  PHQ - 2 Score 0 0 0 0 0 6 3  PHQ- 9 Score      18 6    Fall Risk    04/27/2023    3:02 PM 04/23/2023   12:55 PM 04/22/2022    1:01 PM 04/18/2022    1:50 PM 07/12/2021    11:55 AM  Fall Risk   Falls in the past year? 0 1 1 1  0  Number falls in past yr: 0 0 1 1 0  Injury with Fall? 0 0 0 0 0  Risk for fall due to : No Fall Risks  History of fall(s)    Follow up Falls evaluation completed  Falls evaluation completed;Education provided;Falls prevention discussed      MEDICARE RISK AT HOME: Medicare Risk at Home Any stairs in or around the home?: No If so, are there any without handrails?: No Home free of loose throw rugs in walkways, pet beds, electrical cords, etc?: Yes Adequate lighting in your home to reduce risk of falls?: Yes Life alert?: No Use of a cane, walker or w/c?: Yes Grab bars in the bathroom?: Yes Shower chair or bench in shower?: Yes Elevated toilet seat or a handicapped toilet?: Yes  TIMED UP AND GO:  Was the test performed?  No    Cognitive Function:        04/27/2023    3:04 PM 04/22/2022    1:04 PM 07/10/2020    3:42 PM  6CIT Screen  What Year? 0 points 0 points 0 points  What month? 0 points 0 points 0 points  What time? 0 points 0 points 0 points  Count back from 20 0 points 0 points 0 points  Months in reverse 4 points 0 points 0 points  Repeat phrase 0 points 0 points 0 points  Total Score 4 points 0 points 0 points    Immunizations Immunization History  Administered Date(s) Administered   Influenza Split 08/20/2013, 09/27/2014   Influenza-Unspecified 10/14/2016, 09/23/2020, 10/05/2021, 09/24/2022   Moderna SARS-COV2 Booster Vaccination 04/12/2020, 09/24/2022   Moderna Sars-Covid-2 Vaccination 03/01/2019, 03/29/2019, 11/09/2019, 04/12/2020, 10/16/2021   Pfizer Covid-19 Vaccine Bivalent Booster 71yrs & up 09/30/2020   Pneumococcal Conjugate-13 01/20/2014   Pneumococcal Polysaccharide-23 01/28/2015, 10/01/2017   Zoster Recombinant(Shingrix ) 10/15/2022    TDAP status: Due, Education has been provided regarding the importance of this vaccine. Advised may receive this vaccine at local pharmacy or Health Dept.  Aware to provide a copy of the vaccination record if obtained from local pharmacy or Health Dept. Verbalized acceptance and understanding.  Flu Vaccine status: Up to date  Pneumococcal vaccine status: Up to date  Covid-19 vaccine status: Information provided on how to obtain vaccines.   Qualifies for Shingles Vaccine? Yes   Zostavax completed No   Shingrix  Completed?: No.    Education has been provided regarding the importance of this vaccine. Patient has been advised to call insurance company to determine out of pocket expense if they have not yet received this vaccine. Advised may also receive vaccine at local pharmacy or Health Dept. Verbalized acceptance and understanding.  Screening Tests Health Maintenance  Topic Date Due   OPHTHALMOLOGY EXAM  Never done   COVID-19 Vaccine (9 - 2024-25 season) 11/19/2022   Zoster Vaccines- Shingrix  (2 of 2) 06/15/2023 (Originally 12/10/2022)   Diabetic kidney evaluation - Urine ACR  06/22/2023   INFLUENZA VACCINE  08/06/2023   MAMMOGRAM  08/29/2023   HEMOGLOBIN A1C  09/15/2023   Diabetic kidney evaluation - eGFR measurement  03/14/2024   FOOT EXAM  03/14/2024   Medicare Annual Wellness (AWV)  04/26/2024   Colonoscopy  08/30/2029   Pneumonia Vaccine 2+ Years old  Completed   DEXA SCAN  Completed   HPV VACCINES  Aged Out   Meningococcal B Vaccine  Aged Out   DTaP/Tdap/Td  Discontinued   Hepatitis C Screening  Discontinued    Health Maintenance  Health Maintenance Due  Topic Date Due   OPHTHALMOLOGY EXAM  Never done   COVID-19 Vaccine (9 - 2024-25 season) 11/19/2022    Colorectal cancer screening: Type of screening: Colonoscopy. Completed 08/31/2019. Repeat every 10 years  Mammogram status: Ordered 04/27/2023. Pt provided with contact info and advised to call to schedule appt.   Bone Density status: Completed 05/13/2022. Results reflect: Bone density results: NORMAL. Repeat every 2 years.  Lung Cancer Screening: (Low Dose CT Chest  recommended if Age 48-80 years, 20 pack-year currently smoking OR have quit w/in 15years.) does not qualify.   Lung Cancer Screening Referral: n/a  Additional Screening:  Hepatitis C Screening: does qualify; Completed not yet  Vision Screening: Recommended annual ophthalmology exams for early detection of glaucoma and other disorders of the eye. Is the patient up to date with their annual eye exam?  Yes  Who is the provider or what is the name of the office in which the patient attends annual eye exams? [facility] If pt is not established with a provider, would they like to be referred to a provider to establish care?  N/a .   Dental Screening: Recommended annual dental exams for proper oral hygiene  Diabetic Foot Exam: Diabetic Foot Exam: Completed 03/15/2023  Community Resource Referral / Chronic Care Management: CRR required this visit?  No   CCM required this visit?  No     Plan:     I have personally reviewed and noted the following in the patient's chart:   Medical and social history Use of alcohol, tobacco or illicit drugs  Current medications and supplements including opioid prescriptions. Patient is not currently taking opioid prescriptions. Functional ability and status Nutritional status Physical activity Advanced directives List of other physicians Hospitalizations, surgeries, and ER visits in previous 12 months. None Vitals Screenings to include cognitive, depression, and falls Referrals and appointments  In addition, I have reviewed and discussed with patient certain preventive protocols, quality metrics, and best practice recommendations. A written personalized care plan for preventive services as well as general preventive health recommendations were provided to patient.     Jodi Cobb, [place]   04/27/2023   After Visit Summary: (MyChart) Due to this being a telephonic visit, the after visit summary with patients  personalized plan was offered to patient via MyChart   Nurse Notes:   Jodi Cobb is a 73 y.o. female patient of Cherre Cornish, NP who had a Medicare Annual Wellness Visit today via telephone. Jodi Cobb is Retired and lives alone. She has 1 child. She reports that she is socially active and does interact with friends/family regularly. She is minimally physically active and enjoys playing word games on her tablet.   Mammogram ordered.   Sent record request for last eye exam.

## 2023-04-27 NOTE — Patient Instructions (Signed)
  Jodi Cobb , Thank you for taking time to come for your Medicare Wellness Visit. I appreciate your ongoing commitment to your health goals. Please review the following plan we discussed and let me know if I can assist you in the future.   These are the goals we discussed:  Goals       Patient Stated (pt-stated)      07/10/2020 AWV Goal: Exercise for General Health  Patient will verbalize understanding of the benefits of increased physical activity: Exercising regularly is important. It will improve your overall fitness, flexibility, and endurance. Regular exercise also will improve your overall health. It can help you control your weight, reduce stress, and improve your bone density. Over the next year, patient will increase physical activity as tolerated with a goal of at least 150 minutes of moderate physical activity per week.  You can tell that you are exercising at a moderate intensity if your heart starts beating faster and you start breathing faster but can still hold a conversation. Moderate-intensity exercise ideas include: Walking 1 mile (1.6 km) in about 15 minutes Biking Hiking Golfing Dancing Water aerobics Patient will verbalize understanding of everyday activities that increase physical activity by providing examples like the following: Yard work, such as: Insurance underwriter Gardening Washing windows or floors Patient will be able to explain general safety guidelines for exercising:  Before you start a new exercise program, talk with your health care provider. Do not exercise so much that you hurt yourself, feel dizzy, or get very short of breath. Wear comfortable clothes and wear shoes with good support. Drink plenty of water while you exercise to prevent dehydration or heat stroke. Work out until your breathing and your heartbeat get faster.       Patient Stated (pt-stated)       Patient stated that she would like to loose weight.      Patient Stated      Patient stated she would like to lose weight.         This is a list of the screening recommended for you and due dates:  Health Maintenance  Topic Date Due   Eye exam for diabetics  Never done   COVID-19 Vaccine (9 - 2024-25 season) 11/19/2022   Zoster (Shingles) Vaccine (2 of 2) 06/15/2023*   Yearly kidney health urinalysis for diabetes  06/22/2023   Flu Shot  08/06/2023   Mammogram  08/29/2023   Hemoglobin A1C  09/15/2023   Yearly kidney function blood test for diabetes  03/14/2024   Complete foot exam   03/14/2024   Medicare Annual Wellness Visit  04/26/2024   Colon Cancer Screening  08/30/2029   Pneumonia Vaccine  Completed   DEXA scan (bone density measurement)  Completed   HPV Vaccine  Aged Out   Meningitis B Vaccine  Aged Out   DTaP/Tdap/Td vaccine  Discontinued   Hepatitis C Screening  Discontinued  *Topic was postponed. The date shown is not the original due date.

## 2023-05-26 ENCOUNTER — Ambulatory Visit (INDEPENDENT_AMBULATORY_CARE_PROVIDER_SITE_OTHER)

## 2023-05-26 DIAGNOSIS — Z1231 Encounter for screening mammogram for malignant neoplasm of breast: Secondary | ICD-10-CM | POA: Diagnosis not present

## 2023-06-01 ENCOUNTER — Ambulatory Visit: Payer: Self-pay | Admitting: Medical-Surgical

## 2023-07-05 ENCOUNTER — Ambulatory Visit: Admitting: Medical-Surgical

## 2024-04-27 ENCOUNTER — Ambulatory Visit
